# Patient Record
Sex: Female | Born: 2000 | Race: Black or African American | Hispanic: No | Marital: Single | Smoking: Current every day smoker
Health system: Southern US, Community
[De-identification: ages and names within clinical notes are randomized; demographics above are authoritative.]

## PROBLEM LIST (undated history)

## (undated) DIAGNOSIS — R011 Cardiac murmur, unspecified: Secondary | ICD-10-CM

## (undated) DIAGNOSIS — T7840XA Allergy, unspecified, initial encounter: Secondary | ICD-10-CM

## (undated) DIAGNOSIS — F329 Major depressive disorder, single episode, unspecified: Secondary | ICD-10-CM

## (undated) DIAGNOSIS — R51 Headache: Secondary | ICD-10-CM

## (undated) DIAGNOSIS — F909 Attention-deficit hyperactivity disorder, unspecified type: Secondary | ICD-10-CM

## (undated) DIAGNOSIS — F419 Anxiety disorder, unspecified: Secondary | ICD-10-CM

## (undated) DIAGNOSIS — R569 Unspecified convulsions: Secondary | ICD-10-CM

## (undated) DIAGNOSIS — H539 Unspecified visual disturbance: Secondary | ICD-10-CM

## (undated) DIAGNOSIS — F32A Depression, unspecified: Secondary | ICD-10-CM

## (undated) HISTORY — DX: Depression, unspecified: F32.A

## (undated) HISTORY — DX: Major depressive disorder, single episode, unspecified: F32.9

---
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
## 2018-05-26 ENCOUNTER — Emergency Department (HOSPITAL_COMMUNITY)
Admission: EM | Admit: 2018-05-26 | Discharge: 2018-05-27 | Disposition: A | Discharge status: Other Acute Inpatient Hospital | Payer: Medicaid Other | Attending: Emergency Medicine | Admitting: Emergency Medicine

## 2018-05-26 ENCOUNTER — Other Ambulatory Visit: Payer: Self-pay

## 2018-05-26 DIAGNOSIS — F909 Attention-deficit hyperactivity disorder, unspecified type: Secondary | ICD-10-CM | POA: Diagnosis not present

## 2018-05-26 DIAGNOSIS — F332 Major depressive disorder, recurrent severe without psychotic features: Secondary | ICD-10-CM | POA: Diagnosis not present

## 2018-05-26 DIAGNOSIS — Z91040 Latex allergy status: Secondary | ICD-10-CM | POA: Insufficient documentation

## 2018-05-26 DIAGNOSIS — R45851 Suicidal ideations: Secondary | ICD-10-CM | POA: Diagnosis not present

## 2018-05-26 DIAGNOSIS — T43506A Underdosing of unspecified antipsychotics and neuroleptics, initial encounter: Secondary | ICD-10-CM | POA: Diagnosis not present

## 2018-05-26 DIAGNOSIS — F10920 Alcohol use, unspecified with intoxication, uncomplicated: Secondary | ICD-10-CM

## 2018-05-26 DIAGNOSIS — F10120 Alcohol abuse with intoxication, uncomplicated: Secondary | ICD-10-CM | POA: Insufficient documentation

## 2018-05-26 DIAGNOSIS — R51 Headache: Secondary | ICD-10-CM | POA: Diagnosis not present

## 2018-05-26 DIAGNOSIS — Y906 Blood alcohol level of 120-199 mg/100 ml: Secondary | ICD-10-CM | POA: Insufficient documentation

## 2018-05-26 DIAGNOSIS — Z79899 Other long term (current) drug therapy: Secondary | ICD-10-CM | POA: Diagnosis not present

## 2018-05-26 DIAGNOSIS — Z8709 Personal history of other diseases of the respiratory system: Secondary | ICD-10-CM | POA: Diagnosis not present

## 2018-05-26 DIAGNOSIS — R05 Cough: Secondary | ICD-10-CM | POA: Insufficient documentation

## 2018-05-26 DIAGNOSIS — F329 Major depressive disorder, single episode, unspecified: Secondary | ICD-10-CM | POA: Diagnosis present

## 2018-05-26 LAB — ETHANOL: Alcohol, Ethyl (B): 147 mg/dL — ABNORMAL HIGH (ref <10)

## 2018-05-26 LAB — COMPREHENSIVE METABOLIC PANEL
ALT: 14 U/L (ref 0–44)
AST: 22 U/L (ref 15–41)
Albumin: 4 g/dL (ref 3.5–5.0)
Alkaline Phosphatase: 76 U/L (ref 38–126)
Anion gap: 14 (ref 5–15)
BUN: <5 mg/dL — ABNORMAL LOW (ref 6–20)
CO2: 19 mmol/L — ABNORMAL LOW (ref 22–32)
Calcium: 8.8 mg/dL — ABNORMAL LOW (ref 8.9–10.3)
Chloride: 104 mmol/L (ref 98–111)
Creatinine, Ser: 0.81 mg/dL (ref 0.44–1.00)
GFR calc Af Amer: >60 mL/min (ref >60)
GFR calc non Af Amer: >60 mL/min (ref >60)
Glucose, Bld: 180 mg/dL — ABNORMAL HIGH (ref 70–99)
Potassium: 3.1 mmol/L — ABNORMAL LOW (ref 3.5–5.1)
Sodium: 137 mmol/L (ref 135–145)
Total Bilirubin: 0.2 mg/dL — ABNORMAL LOW (ref 0.3–1.2)
Total Protein: 7.6 g/dL (ref 6.5–8.1)

## 2018-05-26 LAB — CBC
HCT: 41 % (ref 36.0–46.0)
Hemoglobin: 13.4 g/dL (ref 12.0–15.0)
MCH: 31.6 pg (ref 26.0–34.0)
MCHC: 32.7 g/dL (ref 30.0–36.0)
MCV: 96.7 fL (ref 80.0–100.0)
Platelets: 286 10*3/uL (ref 150–400)
RBC: 4.24 MIL/uL (ref 3.87–5.11)
RDW: 12.6 % (ref 11.5–15.5)
WBC: 4.2 10*3/uL (ref 4.0–10.5)
nRBC: 0 % (ref 0.0–0.2)

## 2018-05-26 LAB — RAPID URINE DRUG SCREEN, HOSP PERFORMED
Amphetamines: NOT DETECTED
Barbiturates: NOT DETECTED
Benzodiazepines: NOT DETECTED
Cocaine: POSITIVE — AB
Opiates: NOT DETECTED
Tetrahydrocannabinol: POSITIVE — AB

## 2018-05-26 LAB — I-STAT BETA HCG BLOOD, ED (MC, WL, AP ONLY): I-stat hCG, quantitative: <5 m[IU]/mL (ref <5)

## 2018-05-26 LAB — SALICYLATE LEVEL: Salicylate Lvl: <7 mg/dL (ref 2.8–30.0)

## 2018-05-26 LAB — ACETAMINOPHEN LEVEL: Acetaminophen (Tylenol), Serum: <10 ug/mL — ABNORMAL LOW (ref 10–30)

## 2018-05-26 MED ORDER — POTASSIUM CHLORIDE CRYS ER 20 MEQ PO TBCR
40.0000 meq | EXTENDED_RELEASE_TABLET | Freq: Once | ORAL | Status: AC
  Administered 2018-05-26: 40 meq via ORAL
  Filled 2018-05-26: qty 2

## 2018-05-26 MED ORDER — IBUPROFEN 400 MG PO TABS: 600.0000 mg | ORAL_TABLET | Freq: Four times a day (QID) | ORAL | Status: DC | PRN

## 2018-05-26 MED ORDER — ALBUTEROL SULFATE HFA 108 (90 BASE) MCG/ACT IN AERS: 1.0000 | INHALATION_SPRAY | Freq: Four times a day (QID) | RESPIRATORY_TRACT | Status: DC | PRN

## 2018-05-26 MED ORDER — NICOTINE 21 MG/24HR TD PT24: 21.0000 mg | MEDICATED_PATCH | Freq: Once | TRANSDERMAL | Status: DC | PRN

## 2018-05-26 MED ORDER — ACETAMINOPHEN 500 MG PO TABS
1000.0000 mg | ORAL_TABLET | Freq: Once | ORAL | Status: AC
  Administered 2018-05-26: 03:00:00 1000 mg via ORAL
  Filled 2018-05-26: qty 2

## 2018-05-26 NOTE — ED Notes (Signed)
RN phone given to patient to call her mother.

## 2018-05-26 NOTE — ED Notes (Signed)
Pt belongings inventoried and placed in locker 2 

## 2018-05-26 NOTE — ED Notes (Signed)
Pt. given urine specimen cup at 0300 and asked to provide sample. Pt. reported she could not provide sample. Pt. asked to provide sample again at 0530 and Pt. again said she could not provide sample.

## 2018-05-26 NOTE — ED Notes (Signed)
Pt notified of plan to transport to [facility]. She is agreeable.

## 2018-05-26 NOTE — Progress Notes (Signed)
CSW spoke with Rocky Link in admissions at [facility] and assessed that pt is still being reviewed by his facility. He or other admissions staff will contact Disposition with decision.    Wells Guiles, LCSW, LCAS Disposition CSW [facility]/TTS [phone]

## 2018-05-26 NOTE — ED Notes (Signed)
TTS in process 

## 2018-05-26 NOTE — ED Triage Notes (Signed)
BIB [facility] from home, voluntarily. [facility] called by pt via hotline. Pt threatening suicide. Quit taking percocet 2 days ago. Told [facility] she was trying to get clean. Reporting cough, headache. Hx suicide attempt.

## 2018-05-26 NOTE — Progress Notes (Signed)
TTS consulted with [name], PA who recommends inpt treatment. TTS to seek placement due to no appropriate beds at [facility] per [facility]. EDP Antony Madura, PA-C and pt's nurse Si Raider, RN have been advised of the disposition.  Princess Bruins, MSW, LCSW Therapeutic Triage Specialist  [phone]

## 2018-05-26 NOTE — Progress Notes (Signed)
CSW contacted Rocky Link at [facility] to relate that patient is willing to come to treatment Voluntarily.  He was being pulled to assist in his ED and would need to review patient's chart with his admittting physician.  He will call back in 30-45 minutes.  Timmothy Euler. Kaylyn Lim, MSW, LCSWA Disposition Clinical Social Work [phone] (cell) [phone] (office)

## 2018-05-26 NOTE — ED Notes (Signed)
Pt ambulated self efficiently to restroom with no difficulty. Pt returned safely to bedside. 
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
## 2018-05-26 NOTE — Progress Notes (Signed)
CSW called [facility] and was told that the A/C was still in the ED.  Will advise Brand Males. Second shift CSW to follow-up for placement.  Timmothy Euler. Kaylyn Lim, MSW, LCSWA Disposition Clinical Social Work [phone] (cell) [phone] (office)

## 2018-05-26 NOTE — Progress Notes (Signed)
Pt. meets criteria for inpatient treatment per [name], PA.  No appropriate beds available at [facility]. Referred out to the following hospitals: [facility]  [name]  [facility]  [facility]  [facility]  [facility]  [facility]     Disposition CSW will continue to follow for placement.  Timmothy Euler. Kaylyn Lim, MSW, LCSWA Disposition Clinical Social Work [phone] (cell) [phone] (office)

## 2018-05-26 NOTE — ED Notes (Signed)
Up to bathroom no  Problems noted.

## 2018-05-26 NOTE — ED Notes (Signed)
ED Provider at bedside. 

## 2018-05-26 NOTE — Progress Notes (Signed)
CSW told by Gearldine Bienenstock, RN in [facility] ED that pat's mother may be her guardian.  CSW called patient's mother, Joseph Pierini [phone]), who asked if she could call back in 10-20 minutes.  Timmothy Euler. Kaylyn Lim, MSW, LCSWA Disposition Clinical Social Work [phone] (cell) [phone] (office)

## 2018-05-26 NOTE — ED Provider Notes (Signed)
[name] EMERGENCY DEPARTMENT Provider Note   CSN: [id number] Arrival date & time: 05/26/18  0139    History   Chief Complaint Chief Complaint  Patient presents with  . Suicidal  . Cough  . Shortness of Breath    HPI Ashley Carlson is a 18 y.o. female.    18 year old female with a history of ADHD, anxiety, asthma, depression presents to the emergency department, brought in by [employer] from home voluntarily for suicidal thoughts.  Patient called a suicide hotline and was threatening suicide tonight.  She expresses thoughts of not wanting to be alive, but does not report any plans for self-harm or suicide.  Reports history of suicide attempt from cutting her wrists, though no scarring or evidence of prior trauma visible.  Endorses use of alcohol daily.  States that she drank 1/5 of Hennessy earlier today.  She also had a few beers.  Quit taking Percocet 2 days ago and told [employer] that she was trying to get clean.  She is complaining of a headache at this time for which she has not taken any medication.  Is supposed to be on psychiatric medications, but has been noncompliant.  The history is provided by the patient. No language interpreter was used.    Past Medical History:  Diagnosis Date  . ADHD (attention deficit hyperactivity disorder)    pt. takes vyvanse  . Allergy    Environmental allergies, does not take meds  . Anxiety   . Asthma    uses albuterol inhaler PRN  . Depression   . Headache(784.0)   . Heart murmur    Does not require prophylaxis  . Seizures (HCC)    reports h/o seizures when she was younger but stopped at age 12yo.    . 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 Attention deficit hyperactivity disorder, hyperactive-impulsive type 08/27/2011  . Polysubstance abuse (HCC) 08/27/2011     No past surgical history on file.   OB History    Gravida  0   Para  0   Term  0   Preterm  0   AB  0   Living  0     SAB  0   TAB  0   Ectopic  0   Multiple  0   Live Births               Home Medications    Prior to Admission medications   Medication Sig Start Date End Date Taking? Authorizing Provider  albuterol (PROVENTIL HFA;VENTOLIN HFA) 108 (90 BASE) MCG/ACT inhaler Inhale 2 puffs into the lungs every 4 (four) hours as needed for wheezing or shortness of breath. 02/12/14   Dione Booze, MD  albuterol (PROVENTIL HFA;VENTOLIN HFA) 108 (90 Base) MCG/ACT inhaler Inhale 1-2 puffs into the lungs every 6 (six) hours as needed for wheezing or shortness of breath. 11/29/17   Hedges, Tinnie Gens, PA-C  albuterol (PROVENTIL) (2.5 MG/3ML) 0.083% nebulizer solution Take 3 mLs (2.5 mg total) by nebulization every 4 (four) hours as needed for wheezing or shortness of breath. Patient not taking: Reported on 11/29/2017 02/12/14   Dione Booze, MD    Family History Family History  Problem Relation Age of Onset  . Depression Paternal Grandmother        pt. is vague regarding FMhx   .  Drug abuse Cousin        pt. is vague regarding FMHx    Social History Social History   Tobacco Use  . Smoking status: Never Smoker  . Smokeless tobacco: Never Used  Substance Use Topics  . Alcohol use: Yes    Comment: daily  . Drug use: No     Allergies   Eggs or egg-derived products; Latex; and Milk-related compounds   Review of Systems Review of Systems  Ten systems reviewed and are negative for acute change, except as noted in the HPI.    Physical Exam Updated Vital Signs BP (!) 103/57 (BP Location: Left Arm)   Pulse (!) 105   Temp 98.8 F (37.1 C) (Oral)   Resp 16   LMP 05/15/2018   SpO2 97%   Physical Exam Vitals signs and nursing note reviewed.  Constitutional:      General: She is not in acute distress.    Appearance: She is well-developed. She is not  diaphoretic.     Comments: Nontoxic appearing and in NAD  HENT:     Head: Normocephalic and atraumatic.  Eyes:     General: No scleral icterus.    Conjunctiva/sclera: Conjunctivae normal.  Neck:     Musculoskeletal: Normal range of motion.  Pulmonary:     Effort: Pulmonary effort is normal. No respiratory distress.     Comments: Respirations even and unlabored Musculoskeletal: Normal range of motion.  Skin:    General: Skin is warm and dry.     Coloration: Skin is not pale.     Findings: No erythema or rash.     Comments: No abrasions or scarring to forearms.  Neurological:     Mental Status: She is alert and oriented to person, place, and time.     Coordination: Coordination normal.     Comments: Ambulatory with steady gait.  Psychiatric:        Behavior: Behavior normal.     Comments: Endorses depression with thoughts of "wanting to not be here anymore"; no suicidal plan or other plan for self harm.      ED Treatments / Results  Labs (all labs ordered are listed, but only abnormal results are displayed) Labs Reviewed  COMPREHENSIVE METABOLIC PANEL - Abnormal; Notable for the following components:      Result Value   Potassium 3.1 (*)    CO2 19 (*)    Glucose, Bld 180 (*)    BUN <5 (*)    Calcium 8.8 (*)    Total Bilirubin 0.2 (*)    All other components within normal limits  ETHANOL - Abnormal; Notable for the following components:   Alcohol, Ethyl (B) 147 (*)    All other components within normal limits  ACETAMINOPHEN LEVEL - Abnormal; Notable for the following components:   Acetaminophen (Tylenol), Serum <10 (*)    All other components within normal limits  SALICYLATE LEVEL  CBC  RAPID URINE DRUG SCREEN, HOSP PERFORMED  I-STAT BETA HCG BLOOD, ED ([facility], [facility], [facility] ONLY)    EKG None  Radiology No results found.  Procedures Procedures (including critical care time)  Medications Ordered in ED Medications  albuterol (PROVENTIL HFA;VENTOLIN HFA) 108 (90 Base)  MCG/ACT inhaler 1-2 puff (has no administration in time range)  nicotine (NICODERM CQ - dosed in mg/24 hours) patch 21 mg (has no administration in time range)  ibuprofen (ADVIL,MOTRIN) tablet 600 mg (has no administration in time range)  acetaminophen (TYLENOL) tablet 1,000 mg (1,000 mg Oral  Given 05/26/18 0237)  potassium chloride SA (K-DUR,KLOR-CON) CR tablet 40 mEq (40 mEq Oral Given 05/26/18 0406)    3:36 AM The patient has been evaluated by TTS who recommend placement at outside facility for inpatient treatment.  Labs are generally reassuring.  Alcohol is elevated at 147.  She will be given oral potassium for mild hypokalemia.   Initial Impression / Assessment and Plan / ED Course  I have reviewed the triage vital signs and the nursing notes.  Pertinent labs & imaging results that were available during my care of the patient were reviewed by me and considered in my medical decision making (see chart for details).        Patient presenting voluntarily for worsening depression.  Does not express any specific suicidal thoughts or plan.  Does generally feel dysthymic.  Drank ETOH prior to arrival with level of 147.  UDS pending.  Otherwise, medically cleared.  The patient has been evaluated by TTS who recommend continue inpatient management.  Seeking outside placement at this time.  Disposition to be determined by oncoming ED provider.   Final Clinical Impressions(s) / ED Diagnoses   Final diagnoses:  Suicidal ideation  Severe episode of recurrent major depressive disorder, without psychotic features (HCC)  Alcoholic intoxication without complication [facility])    ED Discharge Orders    None       Antony Madura, PA-C 05/26/18 0520    Palumbo, April, MD 05/26/18 [phone]

## 2018-05-26 NOTE — ED Notes (Signed)
Ordered breakfast tray  

## 2018-05-27 NOTE — ED Notes (Addendum)
BH called and advised the pt has a bed at [facility] at 0800 hrs.

## 2018-05-27 NOTE — Progress Notes (Signed)
Crystal with [facility] called to report the pt has been accepted to their facility, accepting physician is Dr. Shawnee Knapp, MD. Call to repory [phone], bed will be ready at 8am. ED staff Lorin Picket, RN has been advised and states he will inform Becky.  Crystal provided a call back number of [phone] for any questions.  Princess Bruins, MSW, LCSW Therapeutic Triage Specialist  [phone]

## 2018-05-27 NOTE — ED Provider Notes (Signed)
Emergency Medicine Observation Re-evaluation Note  Ashley Carlson is a 18 y.o. female, seen on rounds today.  Pt initially presented to the ED for complaints of Suicidal; Cough; and Shortness of Breath Currently, the patient is sleeping.  Physical Exam  BP (!) 105/58 (BP Location: Right Leg)   Pulse 88   Temp 98.3 F (36.8 C) (Oral)   Resp 16   LMP 05/15/2018   SpO2 97%  Physical Exam  Sleeping, respirations even and unlabored.  ED Course / MDM  EML:JQGB   I have reviewed the labs performed to date as well as medications administered while in observation.  Recent changes in the last 24 hours include stable, awaiting transfer to [facility] this morning. Plan  Current plan is for transfer to [facility] this morning at 0800hrs. Patient is not under full IVC at this time.   Jeannie Fend, PA-C 05/27/18 2010    Shaune Pollack, MD 05/27/18 [phone]

## 2018-05-27 NOTE — ED Notes (Signed)
ORDERED BFAST TRAY  

## 2018-05-27 NOTE — ED Notes (Signed)
Pt had voiced understanding and acceptance of tx plan - Pt called her mother from phone at nurses' desk - who also voiced acceptance of tx plan. Mother initially asked to speak w/RN - mother then noted to be upset stating she wanted to speak w/anyone but this RN. When RN asked pt what was going on, pt stated her mother became upset w/RN she had spoken w/yesterday. When this RN advised mother this is not the same RN she spoke w/yesterday, mother apologized and advised she would get loud or do anything she has to do when it comes her child. Pt's mother re-spoke w/pt after speaking w/RN and is aware pt will be transported to [facility] w/in the next 30 minutes and that pt will need to call to give her a code after her assessment has been completed. ALL of pt's belongings - 1 labeled belongings bag and 1 valuables envelope - [place]. Pt aware.

## 2018-06-04 ENCOUNTER — Other Ambulatory Visit: Payer: Self-pay

## 2018-06-04 ENCOUNTER — Encounter (HOSPITAL_COMMUNITY): Payer: Self-pay | Admitting: Obstetrics and Gynecology

## 2018-06-04 ENCOUNTER — Emergency Department (HOSPITAL_COMMUNITY)
Admission: EM | Admit: 2018-06-04 | Discharge: 2018-06-04 | Disposition: A | Discharge status: Home / Self Care | Payer: Medicaid Other | Attending: Emergency Medicine | Admitting: Emergency Medicine

## 2018-06-04 DIAGNOSIS — F17200 Nicotine dependence, unspecified, uncomplicated: Secondary | ICD-10-CM | POA: Diagnosis not present

## 2018-06-04 DIAGNOSIS — L02411 Cutaneous abscess of right axilla: Secondary | ICD-10-CM

## 2018-06-04 DIAGNOSIS — J45909 Unspecified asthma, uncomplicated: Secondary | ICD-10-CM | POA: Diagnosis not present

## 2018-06-04 DIAGNOSIS — M79621 Pain in right upper arm: Secondary | ICD-10-CM | POA: Diagnosis present

## 2018-06-04 LAB — POC URINE PREG, ED: Preg Test, Ur: NEGATIVE

## 2018-06-04 MED ORDER — LIDOCAINE-EPINEPHRINE (PF) 2 %-1:200000 IJ SOLN
10.0000 mL | Freq: Once | INTRAMUSCULAR | Status: AC
  Administered 2018-06-04: 10 mL
  Filled 2018-06-04: qty 10

## 2018-06-04 MED ORDER — LORAZEPAM 2 MG/ML IJ SOLN: 2.0000 mg | Freq: Once | INTRAMUSCULAR | Status: DC

## 2018-06-04 MED ORDER — DOXYCYCLINE HYCLATE 100 MG PO CAPS: 100.0000 mg | ORAL_CAPSULE | Freq: Two times a day (BID) | ORAL | 0 refills | Status: AC

## 2018-06-04 MED ORDER — LORAZEPAM 2 MG/ML IJ SOLN
2.0000 mg | Freq: Once | INTRAMUSCULAR | Status: AC
  Administered 2018-06-04: 2 mg via INTRAMUSCULAR
  Filled 2018-06-04: qty 1

## 2018-06-04 MED ORDER — HYDROCODONE-ACETAMINOPHEN 5-325 MG PO TABS
2.0000 | ORAL_TABLET | Freq: Once | ORAL | Status: AC
  Administered 2018-06-04: 2 via ORAL
  Filled 2018-06-04: qty 2

## 2018-06-04 NOTE — ED Notes (Signed)
Pt stated she would like numbing spray before her abscess is accessed with the needle/numbing medication. RN aware.

## 2018-06-04 NOTE — ED Notes (Signed)
Incision and drainage tray placed on bedside table.

## 2018-06-04 NOTE — ED Notes (Signed)
RN explained to patient that a urine sample was needed, patient reports she is unable to pee at this time. Pt reports she will tell RN when she can provide a sample

## 2018-06-04 NOTE — Discharge Instructions (Addendum)
You have been diagnosed today with Abscess of the Right Armpit.  At this time there does not appear to be the presence of an emergent medical condition, however there is always the potential for conditions to change. Please read and follow the below instructions.  Please return to the Emergency Department immediately for any new or worsening symptoms. Please be sure to follow up with your Primary Care Provider within one week regarding your visit today; please call their office to schedule an appointment even if you are feeling better for a follow-up visit. Please take the antibiotic doxycycline as prescribed for the next 7 days.  Please follow-up for recheck of your abscess and removal of the packing material in 2 days (48 hours).  You may follow-up for recheck at your primary care provider's office, and urgent care or if necessary here at the emergency department.  You may gently rinse off the area with warm clean water and gently with soap over the next 2 days to facilitate continued drainage.  Get help right away if: You have very bad (severe) pain. You see red streaks on your skin spreading away from the abscess. You have severe pain or bleeding. You cannot eat or drink without vomiting. You have decreased urine output. You become short of breath. You have chest pain. You cough up blood. The area where the incision and drainage occurred becomes numb or it tingles. You have fever Any new/concerning or worsening symptoms  Please read the additional information packets attached to your discharge summary.  Do not take your medicine if  develop an itchy rash, swelling in your mouth or lips, or difficulty breathing.

## 2018-06-04 NOTE — ED Notes (Signed)
Patient advised to NOT DRIVE HOME due to being given narcotic medication while in hospital. Patient stated she was having a friend pick her up from hospital. Patient was advised from PA and this Clinical research associate before discharge.   Patient ambulated out of ED w/ a steady gate.

## 2018-06-04 NOTE — ED Notes (Signed)
Bed: WTR5 Expected date:  Expected time:  Means of arrival:  Comments: 

## 2018-06-04 NOTE — ED Provider Notes (Addendum)
[facility]-EMERGENCY DEPT Provider Note   CSN: [id number] Arrival date & time: 06/04/18  1019    History   Chief Complaint Chief Complaint  Patient presents with  . Abscess    HPI Karsten A R Olsson is a 18 y.o. female presenting today for right axillary pain swelling and erythema.  Patient reports that over the past 4 days she has had increasing area of swelling and redness underneath her right axilla reports constant moderate intensity aching throb worsened with movement and palpation and without alleviating factors.  Patient has never had a prior abscess however she reports that multiple members of her family have had abscesses requiring incision and drainage in the past, patient reports that she shaves her armpits regularly.  Patient denies any fever/chills, recent illness or additional concerns today.     HPI  Past Medical History:  Diagnosis Date  . ADHD (attention deficit hyperactivity disorder)    pt. takes vyvanse  . Allergy    Environmental allergies, does not take meds  . Anxiety   . Asthma    uses albuterol inhaler PRN  . Depression   . Headache(784.0)   . Heart murmur    Does not require prophylaxis  . Seizures (HCC)    reports h/o seizures when she was younger but stopped at age 4yo.    . 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 Attention deficit hyperactivity disorder, hyperactive-impulsive type 08/27/2011  . Polysubstance abuse (HCC) 08/27/2011    History reviewed. No pertinent surgical history.   OB History    Gravida  0   Para  0   Term  0   Preterm  0   AB  0   Living  0     SAB  0   TAB  0   Ectopic  0   Multiple  0   Live Births               Home Medications    Prior to Admission medications   Medication Sig Start Date End Date Taking?  Authorizing Provider  albuterol (PROVENTIL HFA;VENTOLIN HFA) 108 (90 Base) MCG/ACT inhaler Inhale 1-2 puffs into the lungs every 6 (six) hours as needed for wheezing or shortness of breath. 11/29/17  Yes Hedges, Tinnie Gens, PA-C  albuterol (PROVENTIL) (2.5 MG/3ML) 0.083% nebulizer solution Take 3 mLs (2.5 mg total) by nebulization every 4 (four) hours as needed for wheezing or shortness of breath. 02/12/14  Yes Dione Booze, MD  doxycycline (VIBRAMYCIN) 100 MG capsule Take 1 capsule (100 mg total) by mouth 2 (two) times daily for 7 days. 06/04/18 06/11/18  Bill Salinas, PA-C    Family History Family History  Problem Relation Age of Onset  . Depression Paternal Grandmother        pt. is vague regarding FMhx   . Drug abuse Cousin        pt. is vague regarding FMHx    Social History Social History   Tobacco Use  . Smoking status: Current Every Day Smoker  . Smokeless tobacco: Never Used  Substance Use Topics  . Alcohol use: Yes    Comment: daily  . Drug use: No     Allergies   Aspirin; Wheat bran; Eggs or egg-derived products; Latex; and Milk-related compounds   Review of Systems Review of  Systems  Constitutional: Negative.  Negative for chills, diaphoresis and fever.  Respiratory: Negative.  Negative for cough and shortness of breath.   Cardiovascular: Negative.  Negative for chest pain.  Gastrointestinal: Negative.  Negative for abdominal pain, diarrhea, nausea and vomiting.  Musculoskeletal: Negative for arthralgias and myalgias.  Skin: Positive for color change (Abscess of right axilla).  Neurological: Negative.  Negative for weakness and headaches.  All other systems reviewed and are negative.  Physical Exam Updated Vital Signs BP 119/80   Pulse (!) 115   Temp 98.2 F (36.8 C) (Oral)   Resp 18   Ht 5\' 5"  (1.651 m)   Wt 56.7 kg   LMP 05/15/2018   SpO2 100%   BMI 20.80 kg/m   Physical Exam Constitutional:      General: She is not in acute distress.     Appearance: Normal appearance. She is well-developed. She is not ill-appearing or diaphoretic.  HENT:     Head: Normocephalic and atraumatic.     Right Ear: External ear normal.     Left Ear: External ear normal.     Nose: Nose normal.  Eyes:     General: Vision grossly intact. Gaze aligned appropriately.     Pupils: Pupils are equal, round, and reactive to light.  Neck:     Musculoskeletal: Normal range of motion.     Trachea: Trachea and phonation normal. No tracheal deviation.  Pulmonary:     Effort: Pulmonary effort is normal. No respiratory distress.  Abdominal:     General: There is no distension.     Palpations: Abdomen is soft.     Tenderness: There is no abdominal tenderness. There is no guarding or rebound.  Musculoskeletal: Normal range of motion.     Comments: Full range of motion and appropriate strength to all movements of the bilateral upper extremities.  Skin:    General: Skin is warm and dry.          Comments: Patient with 3 cm x 2 cm area of fluctuance of the right axilla, mild amount of surrounding cellulitis, area is tender to palpation.  Recently shaved hair is noted to the right axilla.  Neurological:     Mental Status: She is alert.     GCS: GCS eye subscore is 4. GCS verbal subscore is 5. GCS motor subscore is 6.     Comments: Speech is clear and goal oriented, follows commands Major Cranial nerves without deficit, no facial droop Moves extremities without ataxia, coordination intact Normal gait  Psychiatric:        Behavior: Behavior normal.    ED Treatments / Results  Labs (all labs ordered are listed, but only abnormal results are displayed) Labs Reviewed  POC URINE PREG, ED    EKG None  Radiology No results found.  Procedures .Marland Kitchen.Incision and Drainage Date/Time: 06/04/2018 12:35 PM Performed by: Bill SalinasMorelli, Jaxston Chohan A, PA-C Authorized by: Bill SalinasMorelli, Keona Sheffler A, PA-C   Consent:    Consent obtained:  Verbal   Consent given by:  Patient    Risks discussed:  Incomplete drainage, pain, infection, bleeding and damage to other organs Location:    Type:  Abscess   Size:  3cmx2cm   Location:  Upper extremity   Upper extremity location: Right Axilla. Pre-procedure details:    Skin preparation:  Betadine Sedation:    Sedation type:  Anxiolysis Anesthesia (see MAR for exact dosages):    Anesthesia method:  Topical application and local infiltration   Topical anesthesia:  Spray.   Local anesthetic:  Lidocaine 1% WITH epi Procedure type:    Complexity:  Simple Procedure details:    Needle aspiration: yes     Needle size:  22 G   Incision types:  Single straight   Scalpel blade:  11   Wound management:  Probed and deloculated   Drainage:  Purulent   Drainage amount:  Copious   Wound treatment:  Wound left open and drain placed   Packing materials:  1/4 in iodoform gauze   Amount 1/4" iodoform:  2cm within; 2 cm outside Post-procedure details:    Patient tolerance of procedure:  Tolerated with difficulty   (including critical care time)  Medications Ordered in ED Medications  HYDROcodone-acetaminophen (NORCO/VICODIN) 5-325 MG per tablet 2 tablet (2 tablets Oral Given 06/04/18 1057)  lidocaine-EPINEPHrine (XYLOCAINE W/EPI) 2 %-1:200000 (PF) injection 10 mL (10 mLs Infiltration Given by Other 06/04/18 1140)  LORazepam (ATIVAN) injection 2 mg (2 mg Intramuscular Given 06/04/18 1136)     Initial Impression / Assessment and Plan / ED Course  I have reviewed the triage vital signs and the nursing notes.  Pertinent labs & imaging results that were available during my care of the patient were reviewed by me and considered in my medical decision making (see chart for details).  Clinical Course as of Jun 04 1247  Sun Jun 04, 2018  1122 Patient unable to tolerated procedure; jumping away from need and unable to inject lidocaine. Discussed with Dr. Freida Busman; will give 2mg  Ativan IM and reattempt.   [BM]  1227 Successful incision and  drainage of right axillary abscess.   [BM]    Clinical Course User Index [BM] Bill Salinas, PA-C    18 year old female presenting with single skin abscess of the right axilla that is amenable to incision and drainage.  Incision and drainage performed as above, abscess was packed as above.  Patient instructed to have wound rechecked in 2 days.  Encouraged warm soaks at home/flushing and twice daily dressing changes.  Patient with mild surrounding cellulitis of the skin, prescribed doxycycline twice daily x7 days.  Extensive return precautions discussed with patient who states understanding.  Urine pregnancy test today is negative and she denies any known antibiotic allergies.   ---------------------- Prior to administration of sedating medications here in the emergency department patient was advised that she cannot drive home and her family member need to pick her up.  She stated understanding.  At discharge I rediscussed this with the patient and despite her feeling that she could not drive I advised her that legally she could not drive home and needed a family member to pick her up.  Patient called her mother on the phone and is waiting outside of the emergency department for a ride home.  [employer] officer on duty was also advised of the situation and is discussing with the patient that she is not to drive home.  Patient is alert and oriented x4 with steady gait she states full understanding of situation and that she should not drive a vehicle for multiple hours, she reports that her family member is on the way pick her up.  At this time there does not appear to be any evidence of an acute emergency medical condition and the patient appears stable for discharge with appropriate outpatient follow up. Diagnosis was discussed with patient who verbalizes understanding of care plan and is agreeable to discharge. I have discussed return precautions with patient who verbalizes understanding of return  precautions. Patient encouraged to follow-up with their PCP. All questions answered.  Patient has been discharged in good condition.  ------------ Addendum: Per [employer] officer patient left with her mother.   Note: Portions of this report may have been transcribed using voice recognition software. Every effort was made to ensure accuracy; however, inadvertent computerized transcription errors may still be present. Final Clinical Impressions(s) / ED Diagnoses   Final diagnoses:  Abscess of axilla, right    ED Discharge Orders         Ordered    doxycycline (VIBRAMYCIN) 100 MG capsule  2 times daily     06/04/18 1246           Elizabeth Palau 06/04/18 1309    Lorre Nick, MD 06/04/18 1313    Bill Salinas, PA-C 06/04/18 1317    Lorre Nick, MD 06/05/18 1104

## 2018-06-04 NOTE — ED Notes (Addendum)
Patient made aware of need for urine sample. Patient stated she's unable to provide sample at this time. Patient stated she will make staff aware when able. Provider aware of delay.

## 2018-06-04 NOTE — ED Triage Notes (Signed)
Pt reports she has an abscess under her right arm. RN can visually see redness and swelling from abscess in right axilla. Pt reports 10/10 pain.

## 2018-06-13 ENCOUNTER — Emergency Department (HOSPITAL_COMMUNITY)
Admission: EM | Admit: 2018-06-13 | Discharge: 2018-06-13 | Payer: Medicaid Other | Attending: Emergency Medicine | Admitting: Emergency Medicine

## 2018-06-13 ENCOUNTER — Other Ambulatory Visit: Payer: Self-pay

## 2018-06-13 ENCOUNTER — Emergency Department (HOSPITAL_COMMUNITY): Payer: Medicaid Other

## 2018-06-13 ENCOUNTER — Encounter (HOSPITAL_COMMUNITY): Payer: Self-pay

## 2018-06-13 DIAGNOSIS — Z79899 Other long term (current) drug therapy: Secondary | ICD-10-CM | POA: Insufficient documentation

## 2018-06-13 DIAGNOSIS — Z91040 Latex allergy status: Secondary | ICD-10-CM | POA: Insufficient documentation

## 2018-06-13 DIAGNOSIS — F1099 Alcohol use, unspecified with unspecified alcohol-induced disorder: Secondary | ICD-10-CM | POA: Insufficient documentation

## 2018-06-13 DIAGNOSIS — F17200 Nicotine dependence, unspecified, uncomplicated: Secondary | ICD-10-CM | POA: Insufficient documentation

## 2018-06-13 DIAGNOSIS — J45909 Unspecified asthma, uncomplicated: Secondary | ICD-10-CM | POA: Insufficient documentation

## 2018-06-13 DIAGNOSIS — F419 Anxiety disorder, unspecified: Secondary | ICD-10-CM | POA: Diagnosis not present

## 2018-06-13 DIAGNOSIS — R4689 Other symptoms and signs involving appearance and behavior: Secondary | ICD-10-CM | POA: Diagnosis present

## 2018-06-13 DIAGNOSIS — E876 Hypokalemia: Secondary | ICD-10-CM | POA: Diagnosis not present

## 2018-06-13 DIAGNOSIS — F1020 Alcohol dependence, uncomplicated: Secondary | ICD-10-CM

## 2018-06-13 LAB — BASIC METABOLIC PANEL
Anion gap: 10 (ref 5–15)
BUN: 6 mg/dL (ref 6–20)
CO2: 22 mmol/L (ref 22–32)
Calcium: 8.7 mg/dL — ABNORMAL LOW (ref 8.9–10.3)
Chloride: 106 mmol/L (ref 98–111)
Creatinine, Ser: 0.87 mg/dL (ref 0.44–1.00)
GFR calc Af Amer: >60 mL/min (ref >60)
GFR calc non Af Amer: >60 mL/min (ref >60)
Glucose, Bld: 95 mg/dL (ref 70–99)
Potassium: 3.4 mmol/L — ABNORMAL LOW (ref 3.5–5.1)
Sodium: 138 mmol/L (ref 135–145)

## 2018-06-13 LAB — CBC
HCT: 38.9 % (ref 36.0–46.0)
Hemoglobin: 12.4 g/dL (ref 12.0–15.0)
MCH: 30.5 pg (ref 26.0–34.0)
MCHC: 31.9 g/dL (ref 30.0–36.0)
MCV: 95.8 fL (ref 80.0–100.0)
Platelets: 308 10*3/uL (ref 150–400)
RBC: 4.06 MIL/uL (ref 3.87–5.11)
RDW: 11.8 % (ref 11.5–15.5)
WBC: 6.5 10*3/uL (ref 4.0–10.5)
nRBC: 0 % (ref 0.0–0.2)

## 2018-06-13 MED ORDER — SODIUM CHLORIDE 0.9 % IV SOLN: Freq: Once | INTRAVENOUS | Status: DC

## 2018-06-13 MED ORDER — SODIUM CHLORIDE 0.9 % IV BOLUS
1000.0000 mL | Freq: Once | INTRAVENOUS | Status: AC
  Administered 2018-06-13: 1000 mL via INTRAVENOUS

## 2018-06-13 MED ORDER — POTASSIUM CHLORIDE CRYS ER 20 MEQ PO TBCR: 20.0000 meq | EXTENDED_RELEASE_TABLET | Freq: Two times a day (BID) | ORAL | 0 refills | Status: DC

## 2018-06-13 NOTE — ED Notes (Signed)
[employer] at bedside speaking to patient per her request

## 2018-06-13 NOTE — ED Notes (Signed)
Patient ambulated to restroom with minimal assistance. Urine sample at bedside.

## 2018-06-13 NOTE — ED Notes (Signed)
Pt asking multiple times upon arrival from EMS to make a phone call and to speak with [employer].

## 2018-06-13 NOTE — ED Notes (Signed)
Patient refused to sign for discharge. RN signed for discharge. Patient A&O x4 and ambulatory. In police custody upon discharge.

## 2018-06-13 NOTE — Discharge Instructions (Addendum)
Thank you for allowing me to care for you today in the Emergency Department.   Your labs today are reassuring.  Your potassium is slightly low.  Take 2 tablets of potassium daily for the next 5 days.  Use caution with drinking alcohol daily.  Resources within the community to help you with stopping drinking alcohol are attached.  Return to the emergency department for new or worsening symptoms.

## 2018-06-13 NOTE — ED Notes (Signed)
Patient attempting to call people on phone, stating "I cannot calm down unless I talk to my mother." Removed phone from room. Patient not cooperative with replacing blood pressure cuff back on arm. Currently has no complaints of chest pain or shortness of breath. Will continue to monitor patient.

## 2018-06-13 NOTE — ED Provider Notes (Signed)
[facility]-EMERGENCY DEPT Provider Note   CSN: [id number] Arrival date & time: 06/13/18  0112    History   Chief Complaint Chief Complaint  Patient presents with  . Aggressive Behavior    HPI Ashley Carlson is a 18 y.o. female with a history of ADHD, alcohol use disorder, anxiety, asthma, tobacco use disorder, cocaine use disorder, and cyclothymia who presents to the emergency department by EMS with a chief complaint of chest pain.  EMS reports the patient began having shortness of breath and chest pain after Ashley Carlson had been placed under arrest and was in the backseat of a police car.  The patient states " I think I blacked out, and I was having chest pain or shortness of breath.  I have a history of asthma.  I think he was having an asthma attack.  My chest pain or shortness of breath have gotten better.  When I can I talk to the police officer?"  The patient is unable to characterize the pain.  Ashley Carlson states chest pain has been intermittent, central and nonradiating.  Ashley Carlson does report an associated nonproductive cough. Denies wheezing, fever, chills, nausea, vomiting, abdominal pain, numbness, weakness, headache.  EMS reports Ashley Carlson was given a 5 of medazepam IM in route for anxiety.  Blood pressure was normotensive and the patient was tachycardic in the 130s on arrival.  No hypoxia.  Ashley Carlson is a 2 pack/day smoker.  Ashley Carlson reports that Ashley Carlson drank half a gallon of liquor earlier today.  Ashley Carlson states that her last drink was 1 hour prior to arrival.  Ashley Carlson denies any cocaine or other illicit substance use prior to arrival.    The history is provided by the patient. No language interpreter was used.    Past Medical History:  Diagnosis Date  . ADHD (attention deficit hyperactivity disorder)    pt. takes vyvanse  . Allergy    Environmental allergies, does not take meds  . Anxiety   . Asthma    uses albuterol inhaler PRN  . Depression   . Headache(784.0)   . Heart murmur     Does not require prophylaxis  . Seizures (HCC)    reports h/o seizures when Ashley Carlson was younger but stopped at age 89yo.    . 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 Attention deficit hyperactivity disorder, hyperactive-impulsive type 08/27/2011  . Polysubstance abuse (HCC) 08/27/2011    History reviewed. No pertinent surgical history.   OB History    Gravida  0   Para  0   Term  0   Preterm  0   AB  0   Living  0     SAB  0   TAB  0   Ectopic  0   Multiple  0   Live Births               Home Medications    Prior to Admission medications   Medication Sig Start Date End Date Taking? Authorizing Provider  albuterol (PROVENTIL HFA;VENTOLIN HFA) 108 (90 Base) MCG/ACT inhaler Inhale 1-2 puffs into the lungs every 6 (six) hours as needed for wheezing or shortness of breath. 11/29/17   Hedges, Tinnie GensJeffrey, PA-C  albuterol (PROVENTIL) (2.5 MG/3ML) 0.083% nebulizer solution Take 3 mLs (2.5 mg total) by nebulization every 4 (four) hours as  needed for wheezing or shortness of breath. 02/12/14   Dione Booze, MD  potassium chloride SA (K-DUR) 20 MEQ tablet Take 1 tablet (20 mEq total) by mouth 2 (two) times daily for 5 days. 06/13/18 06/18/18  Manuela Halbur, Coral Else, PA-C    Family History Family History  Problem Relation Age of Onset  . Depression Paternal Grandmother        pt. is vague regarding FMhx   . Drug abuse Cousin        pt. is vague regarding FMHx    Social History Social History   Tobacco Use  . Smoking status: Current Every Day Smoker  . Smokeless tobacco: Never Used  Substance Use Topics  . Alcohol use: Yes    Comment: daily  . Drug use: No     Allergies   Aspirin; Wheat bran; Eggs or egg-derived products; Latex; and Milk-related compounds   Review of Systems Review of Systems  Constitutional:  Negative for activity change, chills and fever.  HENT: Negative for congestion.   Eyes: Negative for visual disturbance.  Respiratory: Positive for cough and shortness of breath.   Cardiovascular: Positive for chest pain. Negative for palpitations and leg swelling.  Gastrointestinal: Negative for abdominal pain, diarrhea, nausea and vomiting.  Genitourinary: Negative for dysuria.  Musculoskeletal: Negative for back pain, myalgias, neck pain and neck stiffness.  Skin: Negative for rash.  Allergic/Immunologic: Negative for immunocompromised state.  Neurological: Negative for dizziness, weakness, numbness and headaches.  Psychiatric/Behavioral: Negative for confusion.     Physical Exam Updated Vital Signs BP (!) 102/58 (BP Location: Left Arm)   Pulse 99   Temp 98.9 F (37.2 C) (Oral)   Resp 20   Ht  (1.651 m)   Wt 56.7 kg   LMP 05/15/2018   SpO2 99%   BMI 20.80 kg/m   Physical Exam Vitals signs and nursing note reviewed.  Constitutional:      General: Ashley Carlson is not in acute distress.    Appearance: Ashley Carlson is not ill-appearing or diaphoretic.  HENT:     Head: Normocephalic.  Eyes:     Conjunctiva/sclera:     Right eye: Right conjunctiva is injected.     Left eye: Left conjunctiva is injected.  Neck:     Musculoskeletal: Normal range of motion and neck supple.  Cardiovascular:     Rate and Rhythm: Regular rhythm. Tachycardia present.     Pulses: Normal pulses.     Heart sounds: No murmur. No friction rub. No gallop.   Pulmonary:     Effort: Pulmonary effort is normal. No respiratory distress.     Breath sounds: No stridor. No wheezing, rhonchi or rales.  Chest:     Chest wall: No tenderness.  Abdominal:     General: There is no distension.     Palpations: Abdomen is soft. There is no mass.     Tenderness: There is no abdominal tenderness. There is no right CVA tenderness, left CVA tenderness, guarding or rebound.     Hernia: No hernia is present.  Musculoskeletal:      Right lower leg: No edema.     Left lower leg: No edema.  Skin:    General: Skin is warm.     Findings: No rash.  Neurological:     General: No focal deficit present.     Mental Status: Ashley Carlson is alert.     Comments: Speech is not slurred.  Answers questions appropriately.  Psychiatric:  Attention and Perception: Ashley Carlson is inattentive.        Mood and Affect: Mood is Camisha.        Speech: Speech is rapid and pressured.        Behavior: Behavior is agitated.        Thought Content: Thought content does not include homicidal or suicidal ideation. Thought content does not include homicidal or suicidal plan.      ED Treatments / Results  Labs (all labs ordered are listed, but only abnormal results are displayed) Labs Reviewed  BASIC METABOLIC PANEL - Abnormal; Notable for the following components:      Result Value   Potassium 3.4 (*)    Calcium 8.7 (*)    All other components within normal limits  CBC    EKG EKG Interpretation  Date/Time:  Tuesday June 13 2018 01:30:46 EDT Ventricular Rate:  133 PR Interval:    QRS Duration: 61 QT Interval:  287 QTC Calculation: 427 R Axis:   62 Text Interpretation:  Sinus tachycardia Confirmed by Ross Marcus ([id number]) on 06/13/2018 1:35:17 AM   Radiology Dg Chest Portable 1 View  Result Date: 06/13/2018 CLINICAL DATA:  Shortness of breath EXAM: PORTABLE CHEST 1 VIEW COMPARISON:  11/29/2017 FINDINGS: Heart and mediastinal contours are within normal limits. No focal opacities or effusions. No acute bony abnormality. IMPRESSION: Normal study. Electronically Signed   By: Charlett Nose M.D.   On: 06/13/2018 01:50    Procedures Procedures (including critical care time)  Medications Ordered in ED Medications  sodium chloride 0.9 % bolus 1,000 mL (0 mLs Intravenous Stopped 06/13/18 0346)     Initial Impression / Assessment and Plan / ED Course  I have reviewed the triage vital signs and the nursing notes.  Pertinent labs &  imaging results that were available during my care of the patient were reviewed by me and considered in my medical decision making (see chart for details).  18 year old female with a history of ADHD, alcohol use disorder, anxiety, asthma, tobacco use disorder, cocaine use disorder, and cyclothymia who presents to the emergency department by EMS after the patient began having chest pain or shortness of breath after Ashley Carlson had been placed under arrest.  Ashley Carlson does endorse alcohol use tonight, but denies cocaine or other IV or illicit drug use.  On arrival, Ashley Carlson is normotensive, but is tachycardic in the 130s.  Ashley Carlson was given 5 of IM midazolam by EMS.  EKG with sinus tachycardia.  Chest x-ray is unremarkable.  Will ensure the patient is ambulatory without ataxia given alcohol use.  Ashley Carlson does not otherwise clinically appear intoxicated.  Clinical Course as of Jun 13 350  Tue Jun 13, 2018  0221 Patient recheck.  Nursing staff reports that Ashley Carlson ambulated to the restroom without difficulty and did not have an ataxic gait.  Heart rate is improved from the 130s to the 110s.  Labs from earlier this month were reviewed.  Ashley Carlson did have some mild electrolyte abnormalities at that time.  Will order basic labs and give the patient an IV fluid bolus.   [MM]    Clinical Course User Index [MM] Lillyahna Hemberger A, PA-C   On re-evaluation, Ashley Carlson is resting comfortably.   CBC is unremarkable.  Potassium is 3.4.  Will discharge the patient with a 5-day course of potassium chloride.  Tachycardia has resolved with IV fluid bolus.  Suspect tachycardia may have been secondary to anxiety in the setting of ETOH use. Patient continues to deny shortness  of breath or chest pain.  Ashley Carlson is clinically sober.  Low suspicion for ACS, pneumonia, pneumothorax, esophageal rupture, infectious etiology, or asthma exacerbation.  Ashley Carlson is hemodynamically stable at this time and is safe for discharge.       Final Clinical Impressions(s) / ED Diagnoses    Final diagnoses:  Anxiety  Severe alcohol use disorder (HCC)  Hypokalemia    ED Discharge Orders         Ordered    potassium chloride SA (K-DUR) 20 MEQ tablet  2 times daily     06/13/18 0334           Frederik Pear A, PA-C 06/13/18 0352    Shon Baton, MD 06/13/18 [phone]

## 2018-06-13 NOTE — ED Triage Notes (Signed)
Arrived via EMS, was in the back of a cop car and started to complain of SOB and had an anxiety attack. 20 G PIV in LAC, got 5 mg Versed en route. Patient arrived in restraints.

## 2018-06-13 NOTE — ED Notes (Signed)
Bed: FG18 Expected date:  Expected time:  Means of arrival:  Comments: EMS F Combative

## 2018-06-21 ENCOUNTER — Other Ambulatory Visit: Payer: Self-pay

## 2018-06-21 ENCOUNTER — Emergency Department (HOSPITAL_COMMUNITY)
Admission: EM | Admit: 2018-06-21 | Discharge: 2018-06-21 | Disposition: A | Discharge status: Home / Self Care | Payer: Medicaid Other | Attending: Emergency Medicine | Admitting: Emergency Medicine

## 2018-06-21 ENCOUNTER — Encounter (HOSPITAL_COMMUNITY): Payer: Self-pay

## 2018-06-21 DIAGNOSIS — Y999 Unspecified external cause status: Secondary | ICD-10-CM | POA: Insufficient documentation

## 2018-06-21 DIAGNOSIS — Y9389 Activity, other specified: Secondary | ICD-10-CM | POA: Insufficient documentation

## 2018-06-21 DIAGNOSIS — Y929 Unspecified place or not applicable: Secondary | ICD-10-CM | POA: Diagnosis not present

## 2018-06-21 DIAGNOSIS — Z91040 Latex allergy status: Secondary | ICD-10-CM | POA: Diagnosis not present

## 2018-06-21 DIAGNOSIS — F908 Attention-deficit hyperactivity disorder, other type: Secondary | ICD-10-CM | POA: Diagnosis not present

## 2018-06-21 DIAGNOSIS — J45909 Unspecified asthma, uncomplicated: Secondary | ICD-10-CM | POA: Diagnosis not present

## 2018-06-21 DIAGNOSIS — F17210 Nicotine dependence, cigarettes, uncomplicated: Secondary | ICD-10-CM | POA: Insufficient documentation

## 2018-06-21 DIAGNOSIS — S51811A Laceration without foreign body of right forearm, initial encounter: Secondary | ICD-10-CM | POA: Diagnosis not present

## 2018-06-21 DIAGNOSIS — W25XXXA Contact with sharp glass, initial encounter: Secondary | ICD-10-CM | POA: Insufficient documentation

## 2018-06-21 DIAGNOSIS — Z79899 Other long term (current) drug therapy: Secondary | ICD-10-CM | POA: Diagnosis not present

## 2018-06-21 MED ORDER — CEPHALEXIN 500 MG PO CAPS: 500.0000 mg | ORAL_CAPSULE | Freq: Four times a day (QID) | ORAL | 0 refills | Status: DC

## 2018-06-21 MED ORDER — LIDOCAINE-EPINEPHRINE (PF) 2 %-1:200000 IJ SOLN
20.0000 mL | Freq: Once | INTRAMUSCULAR | Status: AC
  Administered 2018-06-21: 20 mL via INTRADERMAL
  Filled 2018-06-21: qty 20

## 2018-06-21 NOTE — Discharge Instructions (Signed)
Keep area clean by washing with soap and water daily. Apply a bandage at least once daily, change more often if it is dirty Take Keflex 4 times a day for 5 days to prevent infection Watch for signs of infection (redness, drainage, worsening pain) The stitches are absorbable (they will dissolve in several weeks)

## 2018-06-21 NOTE — ED Provider Notes (Signed)
[facility]-EMERGENCY DEPT Provider Note   CSN: [id number] Arrival date & time: 06/21/18  1531    History   Chief Complaint Chief Complaint  Patient presents with  . Laceration    HPI Ashley Carlson is a 18 y.o. female who presents with a right forearm laceration. PMH significant for ADHD, alcohol use disorder, anxiety, asthma, tobacco use disorder, cocaine use disorder, and cyclothymia. She states she cut her R forearm on glass yesterday while playing with her nephew. She applied pressure and a dressing and waited till today to come in but will not elaborate why or go in to further details regarding the injury. She denies weakness of the wrist or numbness/tingling. She states she is UTD on tetanus. Bleeding is controlled.   HPI  Past Medical History:  Diagnosis Date  . ADHD (attention deficit hyperactivity disorder)    pt. takes vyvanse  . Allergy    Environmental allergies, does not take meds  . Anxiety   . Asthma    uses albuterol inhaler PRN  . Depression   . Headache(784.0)   . Heart murmur    Does not require prophylaxis  . Seizures (HCC)    reports h/o seizures when she was younger but stopped at age 89yo.    . 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 Attention deficit hyperactivity disorder, hyperactive-impulsive type 08/27/2011  . Polysubstance abuse (HCC) 08/27/2011    History reviewed. No pertinent surgical history.   OB History    Gravida  0   Para  0   Term  0   Preterm  0   AB  0   Living  0     SAB  0   TAB  0   Ectopic  0   Multiple  0   Live Births               Home Medications    Prior to Admission medications   Medication Sig Start Date End Date Taking? Authorizing Provider  albuterol (PROVENTIL HFA;VENTOLIN HFA) 108 (90 Base) MCG/ACT  inhaler Inhale 1-2 puffs into the lungs every 6 (six) hours as needed for wheezing or shortness of breath. 11/29/17   Hedges, Tinnie GensJeffrey, PA-C  albuterol (PROVENTIL) (2.5 MG/3ML) 0.083% nebulizer solution Take 3 mLs (2.5 mg total) by nebulization every 4 (four) hours as needed for wheezing or shortness of breath. 02/12/14   Dione BoozeGlick, David, MD  potassium chloride SA (K-DUR) 20 MEQ tablet Take 1 tablet (20 mEq total) by mouth 2 (two) times daily for 5 days. 06/13/18 06/18/18  McDonald, Coral ElseMia A, PA-C    Family History Family History  Problem Relation Age of Onset  . Depression Paternal Grandmother        pt. is vague regarding FMhx   . Drug abuse Cousin        pt. is vague regarding FMHx    Social History Social History   Tobacco Use  . Smoking status: Current Every Day Smoker  . Smokeless tobacco: Never Used  Substance Use Topics  . Alcohol use: Yes    Comment: daily  . Drug use: No     Allergies   Aspirin; Wheat bran; Eggs or egg-derived products; Latex; and Milk-related compounds   Review of Systems Review of Systems  Musculoskeletal: Negative for arthralgias.  Skin:  Positive for wound.  All other systems reviewed and are negative.   Physical Exam Updated Vital Signs BP 108/73   Pulse 77   Temp 98.7 F (37.1 C) (Oral)   Resp 14   Ht 5\' 5"  (1.651 m)   Wt 56.7 kg   SpO2 100%   BMI 20.80 kg/m   Physical Exam Vitals signs and nursing note reviewed.  Constitutional:      General: She is not in acute distress.    Appearance: Normal appearance. She is well-developed.     Comments: Texting on phone constantly. Poor eye contact  HENT:     Head: Normocephalic and atraumatic.  Eyes:     General: No scleral icterus.       Right eye: No discharge.        Left eye: No discharge.     Conjunctiva/sclera: Conjunctivae normal.     Pupils: Pupils are equal, round, and reactive to light.  Neck:     Musculoskeletal: Normal range of motion.  Cardiovascular:     Rate and Rhythm:  Normal rate and regular rhythm.  Pulmonary:     Effort: Pulmonary effort is normal. No respiratory distress.     Breath sounds: Normal breath sounds.  Abdominal:     General: There is no distension.     Palpations: Abdomen is soft.     Tenderness: There is no abdominal tenderness.  Musculoskeletal:     Comments: 4cm laceration over anterior R forearm with subcutaneous tissue exposed. Bleeding is controlled. Swelling of wrist and hand. 2+ radial pulse. Normal ROM of wrist  Skin:    General: Skin is warm and dry.  Neurological:     Mental Status: She is alert and oriented to person, place, and time.  Psychiatric:        Attention and Perception: She is inattentive.        Mood and Affect: Mood normal. Affect is inappropriate.        Speech: Speech normal.        Behavior: Behavior is cooperative.      ED Treatments / Results  Labs (all labs ordered are listed, but only abnormal results are displayed) Labs Reviewed - No data to display  EKG None  Radiology No results found.  Procedures .Marland KitchenLaceration Repair Date/Time: 06/21/2018 4:57 PM Performed by: Bethel Born, PA-C Authorized by: Bethel Born, PA-C   Consent:    Consent obtained:  Verbal   Consent given by:  Patient   Risks discussed:  Infection and pain   Alternatives discussed:  No treatment Anesthesia (see MAR for exact dosages):    Anesthesia method:  Local infiltration   Local anesthetic:  Lidocaine 2% WITH epi Laceration details:    Location:  Shoulder/arm   Shoulder/arm location:  R lower arm   Length (cm):  4   Depth (mm):  10 Repair type:    Repair type:  Simple Pre-procedure details:    Preparation:  Patient was prepped and draped in usual sterile fashion Exploration:    Wound exploration: wound explored through full range of motion and entire depth of wound probed and visualized     Wound extent: no foreign bodies/material noted, no tendon damage noted and no vascular damage noted    Treatment:    Area cleansed with:  Shur-Clens   Amount of cleaning:  Standard   Irrigation method:  Tap   Visualized foreign bodies/material removed: no   Skin repair:    Repair method:  Sutures   Suture size:  5-0   Wound skin closure material used: Vicryl rapide.   Suture technique:  Simple interrupted   Number of sutures:  2 Approximation:    Approximation:  Loose Post-procedure details:    Dressing:  Sterile dressing   Patient tolerance of procedure:  Tolerated well, no immediate complications   (including critical care time)    Medications Ordered in ED Medications  lidocaine-EPINEPHrine (XYLOCAINE W/EPI) 2 %-1:200000 (PF) injection 20 mL (20 mLs Intradermal Given by Other 06/21/18 1648)     Initial Impression / Assessment and Plan / ED Course  I have reviewed the triage vital signs and the nursing notes.  Pertinent labs & imaging results that were available during my care of the patient were reviewed by me and considered in my medical decision making (see chart for details).  18 year old female presents with a forearm laceration that is >24 hours old. The laceration is large and deep. Bleeding is controlled. She is neurovascularly intact. Since wound is relatively large, I feel that she would benefit from loose closure. Wound was irritated thoroughtly. 2 absorbable sutures were placed. She was given Keflex rx for prophylaxis. Return precautions discussed.   Final Clinical Impressions(s) / ED Diagnoses   Final diagnoses:  Laceration of right forearm, initial encounter    ED Discharge Orders    None       Bethel Born, PA-C 06/21/18 1704    Virgina Norfolk, DO 06/27/18 [phone]

## 2018-06-21 NOTE — ED Triage Notes (Signed)
States was playing with nephew yesterday and cut right forearm on glass no active bleeding at this time states its a bad cut.

## 2018-06-21 NOTE — ED Notes (Signed)
ED Provider at bedside. 

## 2019-04-25 ENCOUNTER — Emergency Department (HOSPITAL_COMMUNITY)
Admission: EM | Admit: 2019-04-25 | Discharge: 2019-04-26 | Disposition: A | Discharge status: Home / Self Care | Payer: Medicaid Other | Attending: Emergency Medicine | Admitting: Emergency Medicine

## 2019-04-25 ENCOUNTER — Other Ambulatory Visit: Payer: Self-pay

## 2019-04-25 ENCOUNTER — Encounter (HOSPITAL_COMMUNITY): Payer: Self-pay

## 2019-04-25 DIAGNOSIS — J45909 Unspecified asthma, uncomplicated: Secondary | ICD-10-CM | POA: Diagnosis not present

## 2019-04-25 DIAGNOSIS — Z98818 Other dental procedure status: Secondary | ICD-10-CM | POA: Diagnosis not present

## 2019-04-25 DIAGNOSIS — R110 Nausea: Secondary | ICD-10-CM

## 2019-04-25 DIAGNOSIS — F909 Attention-deficit hyperactivity disorder, unspecified type: Secondary | ICD-10-CM | POA: Insufficient documentation

## 2019-04-25 DIAGNOSIS — G44209 Tension-type headache, unspecified, not intractable: Secondary | ICD-10-CM

## 2019-04-25 DIAGNOSIS — R42 Dizziness and giddiness: Secondary | ICD-10-CM | POA: Diagnosis present

## 2019-04-25 DIAGNOSIS — Z91040 Latex allergy status: Secondary | ICD-10-CM | POA: Diagnosis not present

## 2019-04-25 DIAGNOSIS — R112 Nausea with vomiting, unspecified: Secondary | ICD-10-CM | POA: Diagnosis not present

## 2019-04-25 DIAGNOSIS — F17210 Nicotine dependence, cigarettes, uncomplicated: Secondary | ICD-10-CM | POA: Diagnosis not present

## 2019-04-25 DIAGNOSIS — Z79899 Other long term (current) drug therapy: Secondary | ICD-10-CM | POA: Diagnosis not present

## 2019-04-25 DIAGNOSIS — L309 Dermatitis, unspecified: Secondary | ICD-10-CM | POA: Diagnosis not present

## 2019-04-25 NOTE — ED Triage Notes (Signed)
Pt reports that she had her wisdom teeth removed 2 weeks ago. Starting Monday, pt reports that she experienced a sore throat, nasal congestion, chills headache, N/V/D. Pt denies loss of taste or smell, CP, SHOB, or fever. Pt A&Ox4 and ambulatory in triage.

## 2019-04-25 NOTE — ED Provider Notes (Signed)
[facility]-EMERGENCY DEPT Provider Note   CSN: [id number] Arrival date & time: 04/25/19  2100     History Chief Complaint  Patient presents with  . Flu-Like Symptoms    Ashley Carlson is a 19 y.o. female.  Ashley Carlson is a 19 y.o. female who presents with multiple complaints. PMH significant for ADHD, alcohol use disorder, anxiety, asthma, tobacco use disorder, cocaine use disorder, and cyclothymia. She states that she had her wisdom teeth removed at [facility] about 10 days ago. She has been taking penicillin and hydrocodone since the operation and notes that her pain worsened so she went to see her oral surgeon two days ago when gave her additional penicillin and hydrocodone and noted that "she was healing well". She states that she feels intermittent lightheadedness, subjective fevers, chills, sore throat, voice hoarseness, n/v/d, HA and loss of taste. She states her oral surgeon noted that she was given mouthwash s/p the procedure and that this could cause her loss of taste. She is unsure of any sick contacts. She states she vomited one time this morning and "ate some chicken nuggets today" and has not vomited since. She smokes 2 PPD and has continued smoking since her operation. She denies discharge from the oral cavity, trismus, difficulty swallowing, drooling.   Pt also notes a history of eczema. She has mild itching and redness on the right lateral elbow that she states is consistent with prior eczema exacerbations. She requests hydrocortisone cream. No drainage from the site.        Past Medical History:  Diagnosis Date  . ADHD (attention deficit hyperactivity disorder)    pt. takes vyvanse  . Allergy    Environmental allergies, does not take meds  . Anxiety   . Asthma    uses albuterol inhaler PRN  . Depression   . Headache(784.0)   . Heart murmur    Does not require prophylaxis  . Seizures (HCC)    reports h/o seizures when she  was younger but stopped at age 30yo.    . 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 Attention deficit hyperactivity disorder, hyperactive-impulsive type 08/27/2011  . Polysubstance abuse (HCC) 08/27/2011    History reviewed. No pertinent surgical history.   OB History    Gravida  0   Para  0   Term  0   Preterm  0   AB  0   Living  0     SAB  0   TAB  0   Ectopic  0   Multiple  0   Live Births              Family History  Problem Relation Age of Onset  . Depression Paternal Grandmother        pt. is vague regarding FMhx   . Drug abuse Cousin        pt. is vague regarding FMHx    Social History   Tobacco Use  . Smoking status: Current Every Day Smoker  . Smokeless tobacco: Never Used  Substance Use Topics  . Alcohol use: Yes    Comment: daily  . Drug use: No    Home Medications Prior to Admission medications   Medication Sig Start Date End Date Taking? Authorizing Provider  albuterol (PROVENTIL HFA;VENTOLIN  HFA) 108 (90 Base) MCG/ACT inhaler Inhale 1-2 puffs into the lungs every 6 (six) hours as needed for wheezing or shortness of breath. 11/29/17   Hedges, Dellis Filbert, PA-C  albuterol (PROVENTIL) (2.5 MG/3ML) 0.083% nebulizer solution Take 3 mLs (2.5 mg total) by nebulization every 4 (four) hours as needed for wheezing or shortness of breath. 95/28/41   Delora Fuel, MD  cephALEXin (KEFLEX) 500 MG capsule Take 1 capsule (500 mg total) by mouth 4 (four) times daily. 06/21/18   Recardo Evangelist, PA-C  potassium chloride SA (K-DUR) 20 MEQ tablet Take 1 tablet (20 mEq total) by mouth 2 (two) times daily for 5 days. 06/13/18 06/18/18  McDonald, Mia A, PA-C    Allergies    Aspirin, Wheat bran, Eggs or egg-derived products, Latex, and Milk-related compounds  Review of Systems   Review of Systems    Constitutional: Positive for appetite change, chills and fever (subjective).  HENT: Positive for congestion, dental problem, sore throat and voice change. Negative for drooling, facial swelling and trouble swallowing.   Respiratory: Positive for shortness of breath.   Gastrointestinal: Positive for diarrhea, nausea and vomiting. Negative for abdominal pain and constipation.  Skin: Positive for color change, rash and wound.  Neurological: Positive for light-headedness and headaches. Negative for dizziness.  All other systems reviewed and are negative.   Physical Exam Updated Vital Signs BP 121/73 (BP Location: Left Arm)   Pulse 88   Temp 98.5 F (36.9 C)   Resp 15   Ht 5\' 5"  (1.651 m)   Wt 63.5 kg   LMP 04/04/2019   SpO2 100%   BMI 23.30 kg/m   Physical Exam Vitals and nursing note reviewed.  Constitutional:      General: She is not in acute distress.    Appearance: Normal appearance. She is normal weight. She is not ill-appearing, toxic-appearing or diaphoretic.  HENT:     Head: Normocephalic.     Right Ear: External ear normal.     Left Ear: External ear normal.     Nose: Nose normal. No congestion or rhinorrhea.     Mouth/Throat:     Mouth: Mucous membranes are moist.     Pharynx: No oropharyngeal exudate or posterior oropharyngeal erythema.     Comments: No peritonsillar swelling. No posterior oropharyngeal erythema or edema. Wisdom teeth sites without signs of erythema, edema, or discharge. No drooling. No trismus. Pt able to readily handle secretions.   Moderate TTP noted in the right lateral submandibular region. No erythema.  Cardiovascular:     Rate and Rhythm: Normal rate and regular rhythm.     Pulses: Normal pulses.     Heart sounds: Normal heart sounds.  Pulmonary:     Effort: Pulmonary effort is normal. No respiratory distress.     Breath sounds: No stridor. No rhonchi or rales.     Comments: Adventitious lung sounds noted diffusely in the posterior lung  fields consistent with smoking history  Abdominal:     General: Abdomen is flat.     Palpations: Abdomen is soft.     Tenderness: There is no abdominal tenderness.  Musculoskeletal:     Cervical back: Normal range of motion and neck supple. No rigidity or tenderness.  Lymphadenopathy:     Cervical: No cervical adenopathy.  Skin:    General: Skin is warm and dry.     Capillary Refill: Capillary refill takes less than 2 seconds.     Findings: Erythema and rash present.  Comments: Diffuse excoriations with mild erythema noted to the right lateral elbow.   Neurological:     General: No focal deficit present.     Mental Status: She is alert and oriented to person, place, and time.  Psychiatric:        Mood and Affect: Mood normal.        Behavior: Behavior normal.    ED Results / Procedures / Treatments   Labs (all labs ordered are listed, but only abnormal results are displayed) Labs Reviewed - No data to display  EKG None  Radiology No results found.  Procedures Procedures (including critical care time)  Medications Ordered in ED Medications - No data to display  ED Course  I have reviewed the triage vital signs and the nursing notes.  Pertinent labs & imaging results that were available during my care of the patient were reviewed by me and considered in my medical decision making (see chart for details).    MDM Rules/Calculators/A&P                      12:14 AM Pt is a 19 year old AAF that presents 10 days s/p wisdom tooth extraction. She has multiple complaints but focuses most on her HA, right submandibular pain, and nausea/vomiting. Her HEENT exam was benign and no signs of infection were present. Oral wounds are well healing. Pt is a 2PPD smoker and faint wheezing was present. O2 saturations 100% during exam. Will give Zofran for nausea and Toradol for HA. Will fluid challenge and reassess.   1:03 AM Pt states she is feeling much better. Will give her  hydrocortisone for eczema. Will d/c. Pt made aware to follow up with oral surgeon as needed. Discussed with pt to return with any new or worsening sx. She verbalized understanding. VSS at the time of discharge.   Final Clinical Impression(s) / ED Diagnoses Final diagnoses:  Nausea  Tension headache  Eczema, unspecified type    Rx / DC Orders ED Discharge Orders         Ordered    hydrocortisone cream 1 %  2 times daily     04/26/19 0108           Placido Sou, PA-C 04/26/19 3546    Ward, Layla Maw, DO 04/26/19 [id number]

## 2019-04-26 MED ORDER — HYDROCORTISONE 1 % EX CREA: TOPICAL_CREAM | Freq: Two times a day (BID) | CUTANEOUS | 0 refills | Status: DC

## 2019-04-26 MED ORDER — KETOROLAC TROMETHAMINE 60 MG/2ML IM SOLN
60.0000 mg | Freq: Once | INTRAMUSCULAR | Status: AC
  Administered 2019-04-26: 01:00:00 60 mg via INTRAMUSCULAR
  Filled 2019-04-26: qty 2

## 2019-04-26 MED ORDER — ONDANSETRON 8 MG PO TBDP
8.0000 mg | ORAL_TABLET | Freq: Once | ORAL | Status: AC
  Administered 2019-04-26: 8 mg via ORAL
  Filled 2019-04-26: qty 1

## 2019-04-26 NOTE — ED Notes (Signed)
Patient was given food and drinks per PA.

## 2019-06-12 ENCOUNTER — Emergency Department (HOSPITAL_COMMUNITY): Payer: Medicaid Other

## 2019-06-12 ENCOUNTER — Emergency Department (HOSPITAL_COMMUNITY)
Admission: EM | Admit: 2019-06-12 | Discharge: 2019-06-12 | Disposition: A | Discharge status: Home / Self Care | Payer: Medicaid Other | Attending: Emergency Medicine | Admitting: Emergency Medicine

## 2019-06-12 ENCOUNTER — Other Ambulatory Visit: Payer: Self-pay

## 2019-06-12 ENCOUNTER — Encounter (HOSPITAL_COMMUNITY): Payer: Self-pay

## 2019-06-12 DIAGNOSIS — S92902A Unspecified fracture of left foot, initial encounter for closed fracture: Secondary | ICD-10-CM

## 2019-06-12 DIAGNOSIS — T07XXXA Unspecified multiple injuries, initial encounter: Secondary | ICD-10-CM

## 2019-06-12 DIAGNOSIS — Z79899 Other long term (current) drug therapy: Secondary | ICD-10-CM | POA: Insufficient documentation

## 2019-06-12 DIAGNOSIS — R001 Bradycardia, unspecified: Secondary | ICD-10-CM | POA: Diagnosis not present

## 2019-06-12 DIAGNOSIS — F419 Anxiety disorder, unspecified: Secondary | ICD-10-CM | POA: Insufficient documentation

## 2019-06-12 DIAGNOSIS — S20419A Abrasion of unspecified back wall of thorax, initial encounter: Secondary | ICD-10-CM | POA: Insufficient documentation

## 2019-06-12 DIAGNOSIS — Y999 Unspecified external cause status: Secondary | ICD-10-CM | POA: Diagnosis not present

## 2019-06-12 DIAGNOSIS — S199XXA Unspecified injury of neck, initial encounter: Secondary | ICD-10-CM | POA: Diagnosis not present

## 2019-06-12 DIAGNOSIS — R55 Syncope and collapse: Secondary | ICD-10-CM | POA: Diagnosis not present

## 2019-06-12 DIAGNOSIS — S40212A Abrasion of left shoulder, initial encounter: Secondary | ICD-10-CM | POA: Diagnosis not present

## 2019-06-12 DIAGNOSIS — R9431 Abnormal electrocardiogram [ECG] [EKG]: Secondary | ICD-10-CM | POA: Diagnosis not present

## 2019-06-12 DIAGNOSIS — S3991XA Unspecified injury of abdomen, initial encounter: Secondary | ICD-10-CM | POA: Diagnosis not present

## 2019-06-12 DIAGNOSIS — S0990XA Unspecified injury of head, initial encounter: Secondary | ICD-10-CM | POA: Diagnosis not present

## 2019-06-12 DIAGNOSIS — S92352A Displaced fracture of fifth metatarsal bone, left foot, initial encounter for closed fracture: Secondary | ICD-10-CM | POA: Insufficient documentation

## 2019-06-12 DIAGNOSIS — R000 Tachycardia, unspecified: Secondary | ICD-10-CM | POA: Insufficient documentation

## 2019-06-12 DIAGNOSIS — Z91040 Latex allergy status: Secondary | ICD-10-CM | POA: Diagnosis not present

## 2019-06-12 DIAGNOSIS — Y9389 Activity, other specified: Secondary | ICD-10-CM | POA: Insufficient documentation

## 2019-06-12 DIAGNOSIS — J45909 Unspecified asthma, uncomplicated: Secondary | ICD-10-CM | POA: Diagnosis not present

## 2019-06-12 DIAGNOSIS — S90512A Abrasion, left ankle, initial encounter: Secondary | ICD-10-CM | POA: Diagnosis not present

## 2019-06-12 DIAGNOSIS — R519 Headache, unspecified: Secondary | ICD-10-CM | POA: Diagnosis not present

## 2019-06-12 DIAGNOSIS — S8992XA Unspecified injury of left lower leg, initial encounter: Secondary | ICD-10-CM | POA: Diagnosis not present

## 2019-06-12 DIAGNOSIS — S50312A Abrasion of left elbow, initial encounter: Secondary | ICD-10-CM | POA: Diagnosis not present

## 2019-06-12 DIAGNOSIS — S80212A Abrasion, left knee, initial encounter: Secondary | ICD-10-CM | POA: Diagnosis not present

## 2019-06-12 DIAGNOSIS — R102 Pelvic and perineal pain: Secondary | ICD-10-CM | POA: Diagnosis not present

## 2019-06-12 DIAGNOSIS — S30810A Abrasion of lower back and pelvis, initial encounter: Secondary | ICD-10-CM | POA: Diagnosis not present

## 2019-06-12 DIAGNOSIS — S3993XA Unspecified injury of pelvis, initial encounter: Secondary | ICD-10-CM | POA: Diagnosis not present

## 2019-06-12 DIAGNOSIS — R52 Pain, unspecified: Secondary | ICD-10-CM | POA: Diagnosis not present

## 2019-06-12 DIAGNOSIS — S80812A Abrasion, left lower leg, initial encounter: Secondary | ICD-10-CM | POA: Diagnosis not present

## 2019-06-12 DIAGNOSIS — F17200 Nicotine dependence, unspecified, uncomplicated: Secondary | ICD-10-CM | POA: Insufficient documentation

## 2019-06-12 DIAGNOSIS — Y92410 Unspecified street and highway as the place of occurrence of the external cause: Secondary | ICD-10-CM | POA: Diagnosis not present

## 2019-06-12 DIAGNOSIS — S299XXA Unspecified injury of thorax, initial encounter: Secondary | ICD-10-CM | POA: Diagnosis not present

## 2019-06-12 DIAGNOSIS — S99922A Unspecified injury of left foot, initial encounter: Secondary | ICD-10-CM | POA: Diagnosis present

## 2019-06-12 LAB — COMPREHENSIVE METABOLIC PANEL
ALT: 17 U/L (ref 0–44)
AST: 25 U/L (ref 15–41)
Albumin: 3.6 g/dL (ref 3.5–5.0)
Alkaline Phosphatase: 54 U/L (ref 38–126)
Anion gap: 10 (ref 5–15)
BUN: 5 mg/dL — ABNORMAL LOW (ref 6–20)
CO2: 20 mmol/L — ABNORMAL LOW (ref 22–32)
Calcium: 8.7 mg/dL — ABNORMAL LOW (ref 8.9–10.3)
Chloride: 106 mmol/L (ref 98–111)
Creatinine, Ser: 0.85 mg/dL (ref 0.44–1.00)
GFR calc Af Amer: >60 mL/min (ref >60)
GFR calc non Af Amer: >60 mL/min (ref >60)
Glucose, Bld: 116 mg/dL — ABNORMAL HIGH (ref 70–99)
Potassium: 3.3 mmol/L — ABNORMAL LOW (ref 3.5–5.1)
Sodium: 136 mmol/L (ref 135–145)
Total Bilirubin: 0.3 mg/dL (ref 0.3–1.2)
Total Protein: 6.6 g/dL (ref 6.5–8.1)

## 2019-06-12 LAB — RAPID URINE DRUG SCREEN, HOSP PERFORMED
Amphetamines: NOT DETECTED
Barbiturates: NOT DETECTED
Benzodiazepines: NOT DETECTED
Cocaine: NOT DETECTED
Opiates: NOT DETECTED
Tetrahydrocannabinol: POSITIVE — AB

## 2019-06-12 LAB — I-STAT CHEM 8, ED
BUN: 3 mg/dL — ABNORMAL LOW (ref 6–20)
Calcium, Ion: 1.02 mmol/L — ABNORMAL LOW (ref 1.15–1.40)
Chloride: 103 mmol/L (ref 98–111)
Creatinine, Ser: 0.8 mg/dL (ref 0.44–1.00)
Glucose, Bld: 110 mg/dL — ABNORMAL HIGH (ref 70–99)
HCT: 37 % (ref 36.0–46.0)
Hemoglobin: 12.6 g/dL (ref 12.0–15.0)
Potassium: 3.3 mmol/L — ABNORMAL LOW (ref 3.5–5.1)
Sodium: 138 mmol/L (ref 135–145)
TCO2: 23 mmol/L (ref 22–32)

## 2019-06-12 LAB — URINALYSIS, ROUTINE W REFLEX MICROSCOPIC
Bilirubin Urine: NEGATIVE
Glucose, UA: NEGATIVE mg/dL
Hgb urine dipstick: NEGATIVE
Ketones, ur: NEGATIVE mg/dL
Leukocytes,Ua: NEGATIVE
Nitrite: NEGATIVE
Protein, ur: NEGATIVE mg/dL
Specific Gravity, Urine: >1.046 — ABNORMAL HIGH (ref 1.005–1.030)
pH: 6 (ref 5.0–8.0)

## 2019-06-12 LAB — CBC
HCT: 36.8 % (ref 36.0–46.0)
Hemoglobin: 11.6 g/dL — ABNORMAL LOW (ref 12.0–15.0)
MCH: 30.7 pg (ref 26.0–34.0)
MCHC: 31.5 g/dL (ref 30.0–36.0)
MCV: 97.4 fL (ref 80.0–100.0)
Platelets: 281 10*3/uL (ref 150–400)
RBC: 3.78 MIL/uL — ABNORMAL LOW (ref 3.87–5.11)
RDW: 13.9 % (ref 11.5–15.5)
WBC: 4.3 10*3/uL (ref 4.0–10.5)
nRBC: 0 % (ref 0.0–0.2)

## 2019-06-12 LAB — SAMPLE TO BLOOD BANK

## 2019-06-12 LAB — I-STAT BETA HCG BLOOD, ED (MC, WL, AP ONLY): I-stat hCG, quantitative: <5 m[IU]/mL (ref <5)

## 2019-06-12 LAB — PROTIME-INR
INR: 1 (ref 0.8–1.2)
Prothrombin Time: 13.5 seconds (ref 11.4–15.2)

## 2019-06-12 LAB — ETHANOL: Alcohol, Ethyl (B): 63 mg/dL — ABNORMAL HIGH (ref <10)

## 2019-06-12 LAB — CDS SEROLOGY

## 2019-06-12 MED ORDER — IOHEXOL 300 MG/ML  SOLN
100.0000 mL | Freq: Once | INTRAMUSCULAR | Status: AC | PRN
  Administered 2019-06-12: 100 mL via INTRAVENOUS

## 2019-06-12 MED ORDER — HYDROCODONE-ACETAMINOPHEN 5-325 MG PO TABS: 1.0000 | ORAL_TABLET | Freq: Four times a day (QID) | ORAL | 0 refills | Status: DC | PRN

## 2019-06-12 MED ORDER — SODIUM CHLORIDE 0.9 % IV SOLN: Freq: Once | INTRAVENOUS | Status: AC

## 2019-06-12 MED ORDER — BACITRACIN ZINC 500 UNIT/GM EX OINT: 1.0000 "application " | TOPICAL_OINTMENT | Freq: Two times a day (BID) | CUTANEOUS | 0 refills | Status: DC

## 2019-06-12 MED ORDER — IBUPROFEN 800 MG PO TABS: 800.0000 mg | ORAL_TABLET | Freq: Three times a day (TID) | ORAL | 0 refills | Status: DC

## 2019-06-12 MED ORDER — IBUPROFEN 800 MG PO TABS
800.0000 mg | ORAL_TABLET | Freq: Once | ORAL | Status: AC
  Administered 2019-06-12: 800 mg via ORAL
  Filled 2019-06-12: qty 1

## 2019-06-12 MED ORDER — FENTANYL CITRATE (PF) 100 MCG/2ML IJ SOLN
100.0000 ug | INTRAMUSCULAR | Status: DC | PRN
  Administered 2019-06-12 (×3): 100 ug via INTRAVENOUS
  Filled 2019-06-12 (×3): qty 2

## 2019-06-12 MED ORDER — HYDROCODONE-ACETAMINOPHEN 5-325 MG PO TABS
1.0000 | ORAL_TABLET | Freq: Once | ORAL | Status: AC
  Administered 2019-06-12: 1 via ORAL
  Filled 2019-06-12: qty 1

## 2019-06-12 NOTE — Progress Notes (Signed)
Orthopedic Tech Progress Note Patient Details:  [name] 05-Nov-2000 [id number]  Ortho Devices Type of Ortho Device: Crutches, CAM walker Ortho Device/Splint Location: LLE Ortho Device/Splint Interventions: Application   Post Interventions Patient Tolerated: Well Instructions Provided: Care of device, Adjustment of device   [name] [name] 06/12/2019, 11:05 PM
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
## 2019-06-12 NOTE — Discharge Instructions (Addendum)
1.  Take ibuprofen 800 mg 3 times a day for pain.  Eat a small meal with the tablet.  You may also take 1 to 2 tablets of Vicodin every 6 hours if needed for additional significant pain. 2.  Wear your cam boot and use crutches until you see your doctor in follow-up.  Elevate your left foot is much as possible and apply well wrapped ice pack to areas of swelling. 3.  Keep antibiotic ointment on your abrasions. 4.  Return to the emergency department if you have worsening symptoms

## 2019-06-13 ENCOUNTER — Encounter (HOSPITAL_COMMUNITY): Payer: Self-pay
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
## 2019-06-16 ENCOUNTER — Emergency Department (HOSPITAL_COMMUNITY)
Admission: EM | Admit: 2019-06-16 | Discharge: 2019-06-17 | Disposition: A | Discharge status: Home / Self Care | Payer: Medicaid Other | Attending: Emergency Medicine | Admitting: Emergency Medicine

## 2019-06-16 ENCOUNTER — Encounter (HOSPITAL_COMMUNITY): Payer: Self-pay

## 2019-06-16 ENCOUNTER — Other Ambulatory Visit: Payer: Self-pay

## 2019-06-16 DIAGNOSIS — F17210 Nicotine dependence, cigarettes, uncomplicated: Secondary | ICD-10-CM | POA: Diagnosis not present

## 2019-06-16 DIAGNOSIS — J45909 Unspecified asthma, uncomplicated: Secondary | ICD-10-CM | POA: Diagnosis not present

## 2019-06-16 DIAGNOSIS — S30810A Abrasion of lower back and pelvis, initial encounter: Secondary | ICD-10-CM | POA: Diagnosis not present

## 2019-06-16 DIAGNOSIS — S40212A Abrasion of left shoulder, initial encounter: Secondary | ICD-10-CM | POA: Diagnosis not present

## 2019-06-16 DIAGNOSIS — F0781 Postconcussional syndrome: Secondary | ICD-10-CM | POA: Insufficient documentation

## 2019-06-16 DIAGNOSIS — T07XXXA Unspecified multiple injuries, initial encounter: Secondary | ICD-10-CM

## 2019-06-16 DIAGNOSIS — Y999 Unspecified external cause status: Secondary | ICD-10-CM | POA: Insufficient documentation

## 2019-06-16 DIAGNOSIS — R519 Headache, unspecified: Secondary | ICD-10-CM | POA: Diagnosis not present

## 2019-06-16 DIAGNOSIS — Z79899 Other long term (current) drug therapy: Secondary | ICD-10-CM | POA: Diagnosis not present

## 2019-06-16 DIAGNOSIS — Y939 Activity, unspecified: Secondary | ICD-10-CM | POA: Diagnosis not present

## 2019-06-16 DIAGNOSIS — S40211A Abrasion of right shoulder, initial encounter: Secondary | ICD-10-CM | POA: Diagnosis not present

## 2019-06-16 DIAGNOSIS — Y929 Unspecified place or not applicable: Secondary | ICD-10-CM | POA: Diagnosis not present
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
## 2019-06-17 MED ORDER — PROCHLORPERAZINE MALEATE 10 MG PO TABS
10.0000 mg | ORAL_TABLET | Freq: Once | ORAL | Status: DC
  Filled 2019-06-17: qty 1

## 2019-06-17 MED ORDER — DIPHENHYDRAMINE HCL 25 MG PO CAPS
50.0000 mg | ORAL_CAPSULE | Freq: Once | ORAL | Status: AC
  Administered 2019-06-17: 01:00:00 50 mg via ORAL
  Filled 2019-06-17: qty 2

## 2019-06-17 MED ORDER — PROCHLORPERAZINE MALEATE 10 MG PO TABS: 10.0000 mg | ORAL_TABLET | Freq: Once | ORAL | Status: DC

## 2019-06-17 MED ORDER — BACITRACIN ZINC 500 UNIT/GM EX OINT
TOPICAL_OINTMENT | CUTANEOUS | Status: AC
  Administered 2019-06-17: 2
  Filled 2019-06-17: qty 1.8

## 2019-06-17 MED ORDER — BACITRACIN ZINC 500 UNIT/GM EX OINT: 1.0000 "application " | TOPICAL_OINTMENT | Freq: Two times a day (BID) | CUTANEOUS | 0 refills | Status: DC

## 2019-06-17 MED ORDER — IBUPROFEN 800 MG PO TABS: 800.0000 mg | ORAL_TABLET | Freq: Three times a day (TID) | ORAL | 0 refills | Status: DC | PRN

## 2019-06-17 MED ORDER — BACITRACIN ZINC 500 UNIT/GM EX OINT
TOPICAL_OINTMENT | CUTANEOUS | Status: AC
  Administered 2019-06-17: 5
  Filled 2019-06-17: qty 4.5

## 2019-06-17 MED ORDER — PROCHLORPERAZINE EDISYLATE 10 MG/2ML IJ SOLN
10.0000 mg | Freq: Once | INTRAMUSCULAR | Status: DC
  Filled 2019-06-17: qty 2

## 2019-06-17 MED ORDER — METHOCARBAMOL 500 MG PO TABS: 500.0000 mg | ORAL_TABLET | Freq: Two times a day (BID) | ORAL | 0 refills | Status: DC

## 2019-06-17 NOTE — ED Notes (Signed)
Pt sitting up in bed. NAD noted. Friend at bedside. Pt ready for d/c

## 2019-06-17 NOTE — Discharge Instructions (Addendum)
1. Medications: Ibuprofen or Tylenol for pain; bacitracin for your road rash, Robaxin for muscle spasms 2. Treatment: Rest, ice on head.  Concussion precautions given - keep patient in a quiet, not simulating, dark environment. No TV, computer use, video games until headache is resolved completely. No contact sports until cleared by the pediatrician. 3. Follow Up: With primary care physician on Monday and neurology within 1 week.  Return to the emergency department if patient becomes lethargic, begins vomiting, develops double vision, speech difficulty, problems walking or other change in mental status.
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
## 2019-06-17 NOTE — ED Notes (Signed)
Pt lying in bed. Wounds dressed. Pt denies any other needs. Friend at bedside. Will continue to monitor.

## 2019-06-18 DIAGNOSIS — S92302A Fracture of unspecified metatarsal bone(s), left foot, initial encounter for closed fracture: Secondary | ICD-10-CM | POA: Diagnosis not present

## 2019-06-28 ENCOUNTER — Encounter: Payer: Self-pay | Admitting: Neurology

## 2019-06-28 ENCOUNTER — Ambulatory Visit: Payer: Medicaid Other | Admitting: Neurology

## 2019-07-11 ENCOUNTER — Emergency Department (HOSPITAL_COMMUNITY)
Admission: EM | Admit: 2019-07-11 | Discharge: 2019-07-11 | Disposition: A | Discharge status: Home / Self Care | Payer: Medicaid Other | Attending: Emergency Medicine | Admitting: Emergency Medicine

## 2019-07-11 ENCOUNTER — Encounter (HOSPITAL_COMMUNITY): Payer: Self-pay | Admitting: Emergency Medicine

## 2019-07-11 ENCOUNTER — Other Ambulatory Visit: Payer: Self-pay

## 2019-07-11 DIAGNOSIS — Z5321 Procedure and treatment not carried out due to patient leaving prior to being seen by health care provider: Secondary | ICD-10-CM | POA: Insufficient documentation

## 2019-07-11 DIAGNOSIS — R569 Unspecified convulsions: Secondary | ICD-10-CM | POA: Insufficient documentation

## 2019-07-11 DIAGNOSIS — R457 State of emotional shock and stress, unspecified: Secondary | ICD-10-CM | POA: Diagnosis not present

## 2019-07-11 DIAGNOSIS — R000 Tachycardia, unspecified: Secondary | ICD-10-CM | POA: Diagnosis not present

## 2019-07-11 LAB — BASIC METABOLIC PANEL
Anion gap: 11 (ref 5–15)
BUN: <5 mg/dL — ABNORMAL LOW (ref 6–20)
CO2: 22 mmol/L (ref 22–32)
Calcium: 9.4 mg/dL (ref 8.9–10.3)
Chloride: 105 mmol/L (ref 98–111)
Creatinine, Ser: 0.8 mg/dL (ref 0.44–1.00)
GFR calc Af Amer: >60 mL/min (ref >60)
GFR calc non Af Amer: >60 mL/min (ref >60)
Glucose, Bld: 95 mg/dL (ref 70–99)
Potassium: 3.6 mmol/L (ref 3.5–5.1)
Sodium: 138 mmol/L (ref 135–145)

## 2019-07-11 LAB — CBC
HCT: 40 % (ref 36.0–46.0)
Hemoglobin: 13.1 g/dL (ref 12.0–15.0)
MCH: 31 pg (ref 26.0–34.0)
MCHC: 32.8 g/dL (ref 30.0–36.0)
MCV: 94.8 fL (ref 80.0–100.0)
Platelets: 286 10*3/uL (ref 150–400)
RBC: 4.22 MIL/uL (ref 3.87–5.11)
RDW: 12.9 % (ref 11.5–15.5)
WBC: 7.3 10*3/uL (ref 4.0–10.5)
nRBC: 0 % (ref 0.0–0.2)

## 2019-07-11 LAB — I-STAT BETA HCG BLOOD, ED (MC, WL, AP ONLY): I-stat hCG, quantitative: <5 m[IU]/mL (ref <5)

## 2019-07-11 NOTE — ED Notes (Signed)
Pt sitting outside with two friends

## 2019-07-11 NOTE — ED Notes (Signed)
Ashley Carlson Mother [phone]

## 2019-07-11 NOTE — ED Notes (Signed)
Called for pt x3. Pt left AMA.
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
## 2019-07-30 NOTE — Telephone Encounter (Signed)
This is a 19 year old concern, I am unable to remember this is detail to add a follow up note now.

## 2019-08-01 ENCOUNTER — Emergency Department (HOSPITAL_COMMUNITY): Payer: Medicaid Other

## 2019-08-01 ENCOUNTER — Other Ambulatory Visit: Payer: Self-pay

## 2019-08-01 ENCOUNTER — Encounter (HOSPITAL_COMMUNITY): Payer: Self-pay | Admitting: Emergency Medicine

## 2019-08-01 ENCOUNTER — Emergency Department (HOSPITAL_COMMUNITY)
Admission: EM | Admit: 2019-08-01 | Discharge: 2019-08-01 | Disposition: A | Discharge status: Home / Self Care | Payer: Medicaid Other | Attending: Emergency Medicine | Admitting: Emergency Medicine

## 2019-08-01 DIAGNOSIS — Z91018 Allergy to other foods: Secondary | ICD-10-CM | POA: Insufficient documentation

## 2019-08-01 DIAGNOSIS — R404 Transient alteration of awareness: Secondary | ICD-10-CM | POA: Diagnosis not present

## 2019-08-01 DIAGNOSIS — F10920 Alcohol use, unspecified with intoxication, uncomplicated: Secondary | ICD-10-CM

## 2019-08-01 DIAGNOSIS — Z91040 Latex allergy status: Secondary | ICD-10-CM | POA: Insufficient documentation

## 2019-08-01 DIAGNOSIS — F17210 Nicotine dependence, cigarettes, uncomplicated: Secondary | ICD-10-CM | POA: Insufficient documentation

## 2019-08-01 DIAGNOSIS — R231 Pallor: Secondary | ICD-10-CM | POA: Diagnosis not present

## 2019-08-01 DIAGNOSIS — F10129 Alcohol abuse with intoxication, unspecified: Secondary | ICD-10-CM | POA: Diagnosis not present

## 2019-08-01 DIAGNOSIS — Z91011 Allergy to milk products: Secondary | ICD-10-CM | POA: Diagnosis not present

## 2019-08-01 DIAGNOSIS — F1910 Other psychoactive substance abuse, uncomplicated: Secondary | ICD-10-CM | POA: Insufficient documentation

## 2019-08-01 DIAGNOSIS — W19XXXA Unspecified fall, initial encounter: Secondary | ICD-10-CM | POA: Diagnosis not present

## 2019-08-01 DIAGNOSIS — R000 Tachycardia, unspecified: Secondary | ICD-10-CM | POA: Diagnosis not present

## 2019-08-01 DIAGNOSIS — J45909 Unspecified asthma, uncomplicated: Secondary | ICD-10-CM | POA: Insufficient documentation

## 2019-08-01 DIAGNOSIS — Z79899 Other long term (current) drug therapy: Secondary | ICD-10-CM | POA: Diagnosis not present

## 2019-08-01 DIAGNOSIS — R55 Syncope and collapse: Secondary | ICD-10-CM | POA: Diagnosis not present

## 2019-08-01 DIAGNOSIS — Z91012 Allergy to eggs: Secondary | ICD-10-CM | POA: Diagnosis not present

## 2019-08-01 DIAGNOSIS — R569 Unspecified convulsions: Secondary | ICD-10-CM | POA: Diagnosis not present

## 2019-08-01 LAB — CBC WITH DIFFERENTIAL/PLATELET
Abs Immature Granulocytes: 0 10*3/uL (ref 0.00–0.07)
Basophils Absolute: 0 10*3/uL (ref 0.0–0.1)
Basophils Relative: 1 %
Eosinophils Absolute: 0 10*3/uL (ref 0.0–0.5)
Eosinophils Relative: 1 %
HCT: 34.2 % — ABNORMAL LOW (ref 36.0–46.0)
Hemoglobin: 10.9 g/dL — ABNORMAL LOW (ref 12.0–15.0)
Immature Granulocytes: 0 %
Lymphocytes Relative: 42 %
Lymphs Abs: 1.1 10*3/uL (ref 0.7–4.0)
MCH: 31.1 pg (ref 26.0–34.0)
MCHC: 31.9 g/dL (ref 30.0–36.0)
MCV: 97.7 fL (ref 80.0–100.0)
Monocytes Absolute: 0.2 10*3/uL (ref 0.1–1.0)
Monocytes Relative: 8 %
Neutro Abs: 1.3 10*3/uL — ABNORMAL LOW (ref 1.7–7.7)
Neutrophils Relative %: 48 %
Platelets: 248 10*3/uL (ref 150–400)
RBC: 3.5 MIL/uL — ABNORMAL LOW (ref 3.87–5.11)
RDW: 13.4 % (ref 11.5–15.5)
WBC: 2.7 10*3/uL — ABNORMAL LOW (ref 4.0–10.5)
nRBC: 0 % (ref 0.0–0.2)

## 2019-08-01 LAB — SALICYLATE LEVEL: Salicylate Lvl: <7 mg/dL — ABNORMAL LOW (ref 7.0–30.0)

## 2019-08-01 LAB — ETHANOL: Alcohol, Ethyl (B): 183 mg/dL — ABNORMAL HIGH (ref <10)

## 2019-08-01 LAB — I-STAT BETA HCG BLOOD, ED (MC, WL, AP ONLY): I-stat hCG, quantitative: <5 m[IU]/mL (ref <5)

## 2019-08-01 LAB — ACETAMINOPHEN LEVEL: Acetaminophen (Tylenol), Serum: <10 ug/mL — ABNORMAL LOW (ref 10–30)

## 2019-08-01 NOTE — ED Notes (Signed)
Patient transported to CT 
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
## 2019-08-01 NOTE — Discharge Instructions (Signed)
You have been treated for a syncopal episode.   I want you to follow-up with neurologist for further evaluation and management of your syncopal episodes.  I want you to come back to emergency department if you develop shortness of breath, chest pain, uncontrolled nausea, vomiting, diarrhea, severe lightheadedness, another seizure, passing out as these require further evaluations.

## 2019-08-01 NOTE — ED Provider Notes (Signed)
[facility]-EMERGENCY DEPT Provider Note   CSN: [id number] Arrival date & time: 08/01/19  0031     History Chief Complaint  Patient presents with  . Seizures    Devita A R Brinton is a 19 y.o. female.  Patient to ED by EMS who reports they were called due to patient having seizures. EMS reports she appeared to be post-ictal on their arrival. She reportedly became violent/aggressive and was sedated by EMS with 10 mg Versed and 5 mg Haldol. She is sedated, stable, on arrival to ED and unable to provide history.  The history is provided by the EMS personnel. No language interpreter was used.       Past Medical History:  Diagnosis Date  . ADHD (attention deficit hyperactivity disorder)    pt. takes vyvanse  . Allergy    Environmental allergies, does not take meds  . Anxiety   . Asthma    uses albuterol inhaler PRN  . Depression   . Headache(784.0)   . Heart murmur    Does not require prophylaxis  . Seizures (HCC)    reports h/o seizures when she was younger but stopped at age 13yo.    . 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 Attention deficit hyperactivity disorder, hyperactive-impulsive type 08/27/2011  . Polysubstance abuse (HCC) 08/27/2011    History reviewed. No pertinent surgical history.   OB History   No obstetric history on file.     Family History  Problem Relation Age of Onset  . Depression Paternal Grandmother        pt. is vague regarding FMhx   . Drug abuse Cousin        pt. is vague regarding FMHx    Social History   Tobacco Use  . Smoking status: Current Every Day Smoker  . Smokeless tobacco: Never Used  Substance Use Topics  . Alcohol use: Yes    Comment: daily  . Drug use: No    Home Medications Prior to Admission medications   Medication Sig Start  Date End Date Taking? Authorizing Provider  albuterol (PROAIR HFA) 108 (90 Base) MCG/ACT inhaler Inhale 2 puffs into the lungs every 6 (six) hours as needed for wheezing or shortness of breath.    [provider]  albuterol (PROVENTIL HFA;VENTOLIN HFA) 108 (90 Base) MCG/ACT inhaler Inhale 1-2 puffs into the lungs every 6 (six) hours as needed for wheezing or shortness of breath. Patient not taking: Reported on 04/26/2019 11/29/17   Eyvonne Mechanic, PA-C  albuterol (PROVENTIL) (2.5 MG/3ML) 0.083% nebulizer solution Take 3 mLs (2.5 mg total) by nebulization every 4 (four) hours as needed for wheezing or shortness of breath. Patient not taking: Reported on 04/26/2019 02/12/14   Dione Booze, MD  bacitracin ointment Apply 1 application topically 2 (two) times daily. 06/17/19   Muthersbaugh, Dahlia Client, PA-C  cephALEXin (KEFLEX) 500 MG capsule Take 1 capsule (500 mg total) by mouth 4 (four) times daily. Patient not taking: Reported on 04/26/2019 06/21/18   Bethel Born, PA-C  Fluticasone Propionate HFA (FLOVENT HFA IN) Inhale 2 puffs into the lungs 2 (two) times daily as needed (for flares).    [provider]  HYDROcodone-acetaminophen (NORCO/VICODIN) 5-325 MG tablet Take 1-2 tablets by mouth every 6 (six) hours as needed for moderate pain or severe pain. 06/12/19  Charlesetta Shanks, MD  hydrocortisone cream 1 % Apply topically 2 (two) times daily. Apply to affected area 2 times daily Patient not taking: Reported on 06/16/2019 04/26/19   Rayna Sexton, PA-C  ibuprofen (ADVIL) 800 MG tablet Take 1 tablet (800 mg total) by mouth 3 (three) times daily as needed for headache. 06/17/19   Muthersbaugh, Jarrett Soho, PA-C  methocarbamol (ROBAXIN) 500 MG tablet Take 1 tablet (500 mg total) by mouth 2 (two) times daily. 06/17/19   Muthersbaugh, Jarrett Soho, PA-C  potassium chloride SA (K-DUR) 20 MEQ tablet Take 1 tablet (20 mEq total) by mouth 2 (two) times daily for 5 days. Patient not taking: Reported on 04/26/2019  06/13/18 06/18/18  McDonald, Mia A, PA-C    Allergies    Aspirin, Aspirin, Latex, Wheat bran, Wheat bran, Egg [eggs or egg-derived products], Eggs or egg-derived products, Latex, Milk-related compounds, and Milk-related compounds  Review of Systems   Review of Systems  Reason unable to perform ROS: Patient sedated.    Physical Exam Updated Vital Signs BP (!) 108/58   Pulse (!) 101   Temp 98.1 F (36.7 C) (Axillary)   Resp (!) 28   SpO2 99%   Physical Exam Vitals and nursing note reviewed.  Constitutional:      Appearance: She is well-developed.  HENT:     Head: Atraumatic.  Cardiovascular:     Rate and Rhythm: Normal rate and regular rhythm.  Pulmonary:     Effort: Pulmonary effort is normal.     Breath sounds: No wheezing, rhonchi or rales.     Comments: Maintaining airway. Abdominal:     General: There is no distension.     Palpations: Abdomen is soft.  Musculoskeletal:     Cervical back: Normal range of motion.  Skin:    General: Skin is warm and dry.  Neurological:     Mental Status: She is alert.     Comments: Unable to test.     ED Results / Procedures / Treatments   Labs (all labs ordered are listed, but only abnormal results are displayed) Labs Reviewed - No data to display  EKG None  Radiology No results found.  Procedures Procedures (including critical care time)  Medications Ordered in ED Medications - No data to display  ED Course  I have reviewed the triage vital signs and the nursing notes.  Pertinent labs & imaging results that were available during my care of the patient were reviewed by me and considered in my medical decision making (see chart for details).    MDM Rules/Calculators/A&P                     Patient to ED via EMS after reported seizure, violent post-ictal period, sedated by EMS for safety of patient and care team.   Chart reviewed. No medications for seizures on patient's chart. She was brought to ED on 5/19 for  seizure but left AMA. No documented neurology evaluation/studies, or diagnosis of seizure. ?psuedoseizure.   No seizure activity on arrival.   2:45 - Still sedated, VSS. NAD.   5:30 - still sedated, VSS, NAD.  6:30 - patient is waking up. She is calm, cooperative. Admits to alcohol use but does remember events leading to EMS being called. Labs are unremarkable except for alcohol of 183. VSS throughout the night.   Plan: observe until she can ambulate safely, and can be discharged at that time.   Final Clinical Impression(s) / ED Diagnoses Final diagnoses:  None  1. Alcohol intoxication  Rx / DC Orders ED Discharge Orders    None       Danne Harbor 08/01/19 1751    Dione Booze, MD 08/01/19 [phone]

## 2019-08-01 NOTE — ED Notes (Signed)
Pt's mother called back for an update. Pt's mother was informed that her daughter was stable and had not had any more seizures. Pt's mother requested a drug screen for her daughter. I relayed the information to the PA, and when I went to take the mother off hold she did not answer.

## 2019-08-01 NOTE — ED Notes (Signed)
Patient's mother Cristie Hem called, update with permission from patient.  Mother is upset because she wanted a MD or PA to call her last night and no one did.  Chrissie Noa, [place] made aware and will call to speak with patient mother.

## 2019-08-07 DIAGNOSIS — S92302D Fracture of unspecified metatarsal bone(s), left foot, subsequent encounter for fracture with routine healing: Secondary | ICD-10-CM | POA: Diagnosis not present

## 2020-07-22 ENCOUNTER — Other Ambulatory Visit: Payer: Self-pay

## 2020-07-22 ENCOUNTER — Ambulatory Visit (HOSPITAL_COMMUNITY)
Admission: EM | Admit: 2020-07-22 | Discharge: 2020-07-22 | Disposition: A | Discharge status: Home / Self Care | Payer: Medicaid Other

## 2020-07-22 NOTE — ED Notes (Signed)
Spoke with pt on phone, states left UC. Advised pt to try an appt tomorrow or walkin.

## 2020-07-22 NOTE — ED Triage Notes (Signed)
Called pt 3 x in lobby no answer.  

## 2020-07-23 ENCOUNTER — Ambulatory Visit
Admission: EM | Admit: 2020-07-23 | Discharge: 2020-07-23 | Disposition: A | Discharge status: Home / Self Care | Payer: Medicaid Other | Attending: Emergency Medicine | Admitting: Emergency Medicine

## 2020-07-23 ENCOUNTER — Ambulatory Visit (INDEPENDENT_AMBULATORY_CARE_PROVIDER_SITE_OTHER): Payer: Medicaid Other

## 2020-07-23 ENCOUNTER — Other Ambulatory Visit: Payer: Self-pay

## 2020-07-23 DIAGNOSIS — J4521 Mild intermittent asthma with (acute) exacerbation: Secondary | ICD-10-CM

## 2020-07-23 DIAGNOSIS — J45909 Unspecified asthma, uncomplicated: Secondary | ICD-10-CM | POA: Diagnosis not present

## 2020-07-23 DIAGNOSIS — R062 Wheezing: Secondary | ICD-10-CM | POA: Diagnosis not present

## 2020-07-23 DIAGNOSIS — R059 Cough, unspecified: Secondary | ICD-10-CM | POA: Diagnosis not present

## 2020-07-23 DIAGNOSIS — R051 Acute cough: Secondary | ICD-10-CM | POA: Diagnosis not present

## 2020-07-23 DIAGNOSIS — Z20822 Contact with and (suspected) exposure to covid-19: Secondary | ICD-10-CM | POA: Diagnosis not present

## 2020-07-23 MED ORDER — NAPROXEN 375 MG PO TABS
375.0000 mg | ORAL_TABLET | Freq: Two times a day (BID) | ORAL | 0 refills | Status: DC
Start: 2020-07-23 — End: 2021-10-14

## 2020-07-23 MED ORDER — IPRATROPIUM-ALBUTEROL 0.5-2.5 (3) MG/3ML IN SOLN: 3.0000 mL | Freq: Four times a day (QID) | RESPIRATORY_TRACT | 0 refills | Status: AC | PRN

## 2020-07-23 MED ORDER — BENZONATATE 200 MG PO CAPS: 200.0000 mg | ORAL_CAPSULE | Freq: Three times a day (TID) | ORAL | 0 refills | Status: AC | PRN

## 2020-07-23 MED ORDER — METHYLPREDNISOLONE SODIUM SUCC 125 MG IJ SOLR
125.0000 mg | Freq: Once | INTRAMUSCULAR | Status: AC
  Administered 2020-07-23: 125 mg via INTRAMUSCULAR

## 2020-07-23 MED ORDER — PREDNISONE 50 MG PO TABS: 50.0000 mg | ORAL_TABLET | Freq: Every day | ORAL | 0 refills | Status: AC

## 2020-07-23 NOTE — Discharge Instructions (Addendum)
We gave you a shot of Solu-Medrol, continue with prednisone daily x5 days Albuterol inhaler or nebulizers as needed for shortness of breath chest tightness and wheezing, please monitor your heart rate while using these Tessalon every 8 hours for cough Rest and fluids Please go to emergency room if breathing not improving or worsening despite use with above

## 2020-07-23 NOTE — ED Triage Notes (Signed)
Patient presents to Urgent Care with complaints of SOB, body aches, headache x 2 days. Pt states she had asthma attack two days ago but left before being seen at the [facility]. Pt states needs refill on her albuterol inhaler.

## 2020-07-23 NOTE — ED Provider Notes (Signed)
[facility]    CSN: [id number] Arrival date & time: 07/23/20  1243      History   Chief Complaint Chief Complaint  Patient presents with  . Asthma    SOB     HPI Ashley Carlson is a 20 y.o. female history of asthma presenting today for asthma exacerbation and body aches.  Reports over the past 2 days she has had shortness of breath chest tightness and wheezing.  Reports associated body aches and headaches with cough.  Had mild congestion as well.  Denies any known fevers.  Out of albuterol inhaler.  HPI  Past Medical History:  Diagnosis Date  . ADHD (attention deficit hyperactivity disorder)    pt. takes vyvanse  . Allergy    Environmental allergies, does not take meds  . Anxiety   . Asthma    uses albuterol inhaler PRN  . Depression   . Headache(784.0)   . Heart murmur    Does not require prophylaxis  . Seizures (HCC)    reports h/o seizures when she was younger but stopped at age 56yo.    . 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 Attention deficit hyperactivity disorder, hyperactive-impulsive type 08/27/2011  . Polysubstance abuse (HCC) 08/27/2011    History reviewed. No pertinent surgical history.  OB History   No obstetric history on file.      Home Medications    Prior to Admission medications   Medication Sig Start Date End Date Taking? Authorizing Provider  benzonatate (TESSALON) 200 MG capsule Take 1 capsule (200 mg total) by mouth 3 (three) times daily as needed for up to 7 days for cough. 07/23/20 07/30/20 Yes Seynabou Fults C, PA-C  ipratropium-albuterol (DUONEB) 0.5-2.5 (3) MG/3ML SOLN Take 3 mLs by nebulization every 6 (six) hours as needed. 07/23/20  Yes Kaelum Kissick C, PA-C  naproxen (NAPROSYN) 375 MG tablet Take 1 tablet (375 mg total) by mouth 2 (two) times daily.  07/23/20  Yes Araceli Coufal C, PA-C  predniSONE (DELTASONE) 50 MG tablet Take 1 tablet (50 mg total) by mouth daily with breakfast for 5 days. 07/23/20 07/28/20 Yes Reanne Nellums C, PA-C  albuterol (PROAIR HFA) 108 (90 Base) MCG/ACT inhaler Inhale 2 puffs into the lungs every 6 (six) hours as needed for wheezing or shortness of breath.    [provider]  bacitracin ointment Apply 1 application topically 2 (two) times daily. 06/17/19   Muthersbaugh, Dahlia Client, PA-C  Fluticasone Propionate HFA (FLOVENT HFA IN) Inhale 2 puffs into the lungs 2 (two) times daily as needed (for flares).    [provider]  methocarbamol (ROBAXIN) 500 MG tablet Take 1 tablet (500 mg total) by mouth 2 (two) times daily. 06/17/19   Muthersbaugh, Dahlia Client, PA-C  potassium chloride SA (K-DUR) 20 MEQ tablet Take 1 tablet (20 mEq total) by mouth 2 (two) times daily for 5 days. Patient not taking: Reported on 04/26/2019 06/13/18 07/23/20  Barkley Boards, PA-C    Family History Family History  Problem Relation Age of Onset  . Depression Paternal Grandmother        pt. is vague regarding FMhx   . Drug abuse Cousin        pt. is vague regarding FMHx    Social History Social History   Tobacco Use  . Smoking status:  Current Every Day Smoker  . Smokeless tobacco: Never Used  Substance Use Topics  . Alcohol use: Yes    Comment: daily  . Drug use: No     Allergies   Aspirin, Aspirin, Latex, Wheat bran, Wheat bran, Egg [eggs or egg-derived products], Eggs or egg-derived products, Latex, Milk-related compounds, and Milk-related compounds   Review of Systems Review of Systems  Constitutional: Negative for activity change, appetite change, chills, fatigue and fever.  HENT: Positive for congestion and rhinorrhea. Negative for ear pain, sinus pressure, sore throat and trouble swallowing.   Eyes: Negative for discharge and redness.  Respiratory: Positive for cough, chest tightness, shortness of breath and wheezing.    Cardiovascular: Negative for chest pain.  Gastrointestinal: Negative for abdominal pain, diarrhea, nausea and vomiting.  Musculoskeletal: Negative for myalgias.  Skin: Negative for rash.  Neurological: Negative for dizziness, light-headedness and headaches.     Physical Exam Triage Vital Signs ED Triage Vitals  Enc Vitals Group     BP 07/23/20 1318 124/84     Pulse Rate 07/23/20 1318 (!) 126     Resp --      Temp 07/23/20 1320 98.6 F (37 C)     Temp Source 07/23/20 1320 Oral     SpO2 07/23/20 1318 (!) 89 %     Weight --      Height --      Head Circumference --      Peak Flow --      Pain Score 07/23/20 1314 8     Pain Loc --      Pain Edu? --      Excl. in GC? --    No data found.  Updated Vital Signs BP 124/84 (BP Location: Right Arm)   Pulse (!) 126   Temp 98.6 F (37 C) (Oral)   LMP 07/23/2020   SpO2 93%   Visual Acuity Right Eye Distance:   Left Eye Distance:   Bilateral Distance:    Right Eye Near:   Left Eye Near:    Bilateral Near:     Physical Exam Vitals and nursing note reviewed.  Constitutional:      Appearance: She is well-developed.     Comments: No acute distress  HENT:     Head: Normocephalic and atraumatic.     Ears:     Comments: Bilateral ears without tenderness to palpation of external auricle, tragus and mastoid, EAC's without erythema or swelling, TM's with good bony landmarks and cone of light. Non erythematous.     Nose: Nose normal.     Mouth/Throat:     Comments: Oral mucosa pink and moist, no tonsillar enlargement or exudate. Posterior pharynx patent and nonerythematous, no uvula deviation or swelling. Normal phonation. Eyes:     Conjunctiva/sclera: Conjunctivae normal.  Cardiovascular:     Rate and Rhythm: Tachycardia present.  Pulmonary:     Effort: Pulmonary effort is normal. No respiratory distress.     Comments: Inspiratory and expiratory wheezing and rhonchi noted throughout bilateral lung fields, slight  improvement after albuterol and Solu-Medrol Abdominal:     General: There is no distension.  Musculoskeletal:        General: Normal range of motion.     Cervical back: Neck supple.  Skin:    General: Skin is warm and dry.  Neurological:     Mental Status: She is alert and oriented to person, place, and time.      UC Treatments / Results  Labs (  all labs ordered are listed, but only abnormal results are displayed) Labs Reviewed  NOVEL CORONAVIRUS, NAA    EKG   Radiology DG Chest 2 View  Result Date: 07/23/2020 CLINICAL DATA:  Asthma.  Wheezing and cough EXAM: CHEST - 2 VIEW COMPARISON:  06/12/2019 FINDINGS: The heart size and mediastinal contours are within normal limits. Both lungs are clear. The visualized skeletal structures are unremarkable. IMPRESSION: No active cardiopulmonary disease. Electronically Signed   By: Marlan Palau M.D.   On: 07/23/2020 14:17    Procedures Procedures (including critical care time)  Medications Ordered in UC Medications  methylPREDNISolone sodium succinate (SOLU-MEDROL) 125 mg/2 mL injection 125 mg (125 mg Intramuscular Given 07/23/20 1357)    Initial Impression / Assessment and Plan / UC Course  I have reviewed the triage vital signs and the nursing notes.  Pertinent labs & imaging results that were available during my care of the patient were reviewed by me and considered in my medical decision making (see chart for details).     Chest x-ray unremarkable, treating for viral URI with associated asthma exacerbation-Solu-Medrol prior provided prior to discharge, continue on prednisone x5 days, albuterol inhaler as needed, provided breathing machine along with nebulizers to use as needed at home, continue symptomatic and supportive care of cough and congestion rest and fluids.  Push fluids.  O2 increased to 94/95% prior to discharge  Discussed strict return precautions. Patient verbalized understanding and is agreeable with plan.  Final  Clinical Impressions(s) / UC Diagnoses   Final diagnoses:  Mild intermittent asthma with acute exacerbation     Discharge Instructions     We gave you a shot of Solu-Medrol, continue with prednisone daily x5 days Albuterol inhaler or nebulizers as needed for shortness of breath chest tightness and wheezing, please monitor your heart rate while using these Tessalon every 8 hours for cough Rest and fluids Please go to emergency room if breathing not improving or worsening despite use with above    ED Prescriptions    Medication Sig Dispense Auth. Provider   predniSONE (DELTASONE) 50 MG tablet Take 1 tablet (50 mg total) by mouth daily with breakfast for 5 days. 5 tablet Maryagnes Carrasco C, PA-C   ipratropium-albuterol (DUONEB) 0.5-2.5 (3) MG/3ML SOLN Take 3 mLs by nebulization every 6 (six) hours as needed. 360 mL Mendel Binsfeld C, PA-C   benzonatate (TESSALON) 200 MG capsule Take 1 capsule (200 mg total) by mouth 3 (three) times daily as needed for up to 7 days for cough. 28 capsule Charletha Dalpe C, PA-C   naproxen (NAPROSYN) 375 MG tablet Take 1 tablet (375 mg total) by mouth 2 (two) times daily. 20 tablet Alfonso Carden, [place] C, PA-C     PDMP not reviewed this encounter.   Lew Dawes, [place] 07/23/20 1446

## 2020-07-24 LAB — SARS-COV-2, NAA 2 DAY TAT

## 2020-07-24 LAB — NOVEL CORONAVIRUS, NAA: SARS-CoV-2, NAA: NOT DETECTED

## 2020-11-12 ENCOUNTER — Ambulatory Visit
Admission: EM | Admit: 2020-11-12 | Discharge: 2020-11-12 | Disposition: A | Discharge status: Home / Self Care | Payer: Medicaid Other | Attending: Urgent Care | Admitting: Urgent Care

## 2020-11-12 ENCOUNTER — Other Ambulatory Visit: Payer: Self-pay

## 2020-11-12 ENCOUNTER — Encounter: Payer: Self-pay | Admitting: Emergency Medicine

## 2020-11-12 DIAGNOSIS — R252 Cramp and spasm: Secondary | ICD-10-CM | POA: Diagnosis not present

## 2020-11-12 DIAGNOSIS — Z202 Contact with and (suspected) exposure to infections with a predominantly sexual mode of transmission: Secondary | ICD-10-CM | POA: Diagnosis not present

## 2020-11-12 MED ORDER — METRONIDAZOLE 500 MG PO TABS
500.0000 mg | ORAL_TABLET | Freq: Two times a day (BID) | ORAL | 0 refills | Status: DC
Start: 2020-11-12 — End: 2021-10-14

## 2020-11-12 MED ORDER — TIZANIDINE HCL 4 MG PO TABS
4.0000 mg | ORAL_TABLET | Freq: Every day | ORAL | 0 refills | Status: DC
Start: 2020-11-12 — End: 2021-10-14

## 2020-11-12 NOTE — Discharge Instructions (Signed)
Avoid all forms of sexual intercourse (oral, vaginal, anal) for the next 7 days to avoid spreading/reinfecting or at least until we can see what kinds of infection results are positive. Return if symptoms worsen/do not resolve, you develop fever, abdominal pain, blood in your urine, or are re-exposed to a sexually transmitted infection (STI).  

## 2020-11-12 NOTE — ED Provider Notes (Signed)
[facility]   MRN: [id number] DOB: September 06, 2000  Subjective:   Ashley Carlson is a 20 y.o. female presenting for treatment for trichomoniasis.  Patient is sexually active with 1 female only.  Reports that both of them are asymptomatic.  But she found out she had trichomoniasis this past week. Denies fever, n/v, abdominal pain, pelvic pain, rashes, dysuria, urinary frequency, hematuria, vaginal discharge.  Patient has also had persistent history of cramping in her legs.  Reports that she has gone to the hospital and had complete work-up, states they did not find anything and was prescribed naproxen.  Reports that this helped some but did not make much of a difference.  Symptoms primarily occur at night.  Patient does have history of seizures, reports that she has not had a seizure in years.  She does drink heavily about once a week.  She tries to hydrate with water and Gatorade.  States that when she drinks Gatorade for hangovers it makes her sick to her stomach.  Does not have a primary care provider.  No current facility-administered medications for this encounter.  Current Outpatient Medications:    albuterol (PROAIR HFA) 108 (90 Base) MCG/ACT inhaler, Inhale 2 puffs into the lungs every 6 (six) hours as needed for wheezing or shortness of breath., Disp: , Rfl:    bacitracin ointment, Apply 1 application topically 2 (two) times daily., Disp: 120 g, Rfl: 0   Fluticasone Propionate HFA (FLOVENT HFA IN), Inhale 2 puffs into the lungs 2 (two) times daily as needed (for flares)., Disp: , Rfl:    ipratropium-albuterol (DUONEB) 0.5-2.5 (3) MG/3ML SOLN, Take 3 mLs by nebulization every 6 (six) hours as needed., Disp: 360 mL, Rfl: 0   methocarbamol (ROBAXIN) 500 MG tablet, Take 1 tablet (500 mg total) by mouth 2 (two) times daily., Disp: 20 tablet, Rfl: 0   naproxen (NAPROSYN) 375 MG tablet, Take 1 tablet (375 mg total) by mouth 2 (two) times daily., Disp: 20 tablet, Rfl: 0   Allergies   Allergen Reactions   Aspirin Shortness Of Breath   Aspirin Shortness Of Breath   Latex Hives and Shortness Of Breath   Wheat Bran Shortness Of Breath   Wheat Bran Shortness Of Breath   Egg [Eggs Or Egg-Derived Products] Nausea And Vomiting   Eggs Or Egg-Derived Products Nausea And Vomiting   Latex Other (See Comments)    Breaks out   Milk-Related Compounds Nausea And Vomiting   Milk-Related Compounds Nausea And Vomiting    Past Medical History:  Diagnosis Date   ADHD (attention deficit hyperactivity disorder)    pt. takes vyvanse   Allergy    Environmental allergies, does not take meds   Anxiety    Asthma    uses albuterol inhaler PRN   Depression    Headache(784.0)    Heart murmur    Does not require prophylaxis   Seizures (HCC)    reports h/o seizures when she was younger but stopped at age 31yo.     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 No pertinent surgical history.   Family History  Problem Relation Age of Onset   Depression Paternal Grandmother        pt. is vague regarding FMhx    Drug abuse Cousin        pt. is vague regarding FMHx    Social History   Tobacco Use   Smoking status: Every Day  Smokeless tobacco: Never  Substance Use Topics   Alcohol use: Yes    Comment: daily   Drug use: No    ROS   Objective:   Vitals: BP 123/74 (BP Location: Left Arm)   Pulse (!) 114   Temp 98.4 F (36.9 C) (Oral)   Resp 18   SpO2 95%   Pulse recheck was 82bpm.   Physical Exam Constitutional:      General: She is not in acute distress.    Appearance: Normal appearance. She is well-developed. She is not ill-appearing, toxic-appearing or diaphoretic.  HENT:     Head: Normocephalic and atraumatic.     Nose: Nose normal.     Mouth/Throat:     Mouth: Mucous membranes are moist.     Pharynx: Oropharynx is clear.  Eyes:     General: No scleral icterus.       Right eye: No discharge.        Left eye: No  discharge.     Extraocular Movements: Extraocular movements intact.     Conjunctiva/sclera: Conjunctivae normal.     Pupils: Pupils are equal, round, and reactive to light.  Cardiovascular:     Rate and Rhythm: Normal rate.  Pulmonary:     Effort: Pulmonary effort is normal.  Musculoskeletal:        General: No swelling, tenderness or deformity. Normal range of motion.  Skin:    General: Skin is warm and dry.  Neurological:     General: No focal deficit present.     Mental Status: She is alert and oriented to person, place, and time.     Motor: No weakness.     Coordination: Coordination normal.     Gait: Gait normal.     Deep Tendon Reflexes: Reflexes normal.  Psychiatric:        Mood and Affect: Mood normal.        Behavior: Behavior normal.        Thought Content: Thought content normal.        Judgment: Judgment normal.    Assessment and Plan :   PDMP not reviewed this encounter.  1. Trichomonas exposure   2. Leg cramping     Will treat empirically for trichomonas given her exposure. Labs pending will otherwise treat as appropriate. Patient has had chronic intermittent leg cramping. No acute findings on exam. Has had work up in the past which was negative. Recommended avoidance of alcohol, Gatorade and to hydrate much more consistently with plain water. Recommended she establish care with a new PCP for further work up and follow up. Counseled patient on potential for adverse effects with medications prescribed/recommended today, ER and return-to-clinic precautions discussed, patient verbalized understanding.    Wallis Bamberg, PA-C 11/12/20 1009

## 2020-11-12 NOTE — ED Triage Notes (Signed)
Her partner told her yesterday she had trichomoniasis yesterday, so she wants to be check. Denies symptoms. Also c/o leg cramping at night

## 2020-11-13 LAB — CERVICOVAGINAL ANCILLARY ONLY
Bacterial Vaginitis (gardnerella): POSITIVE — AB
Candida Glabrata: NEGATIVE
Candida Vaginitis: NEGATIVE
Chlamydia: NEGATIVE
Comment: NEGATIVE
Comment: NEGATIVE
Comment: NEGATIVE
Comment: NEGATIVE
Comment: NEGATIVE
Comment: NORMAL
Neisseria Gonorrhea: NEGATIVE
Trichomonas: NEGATIVE

## 2021-01-22 DIAGNOSIS — Z419 Encounter for procedure for purposes other than remedying health state, unspecified: Secondary | ICD-10-CM | POA: Diagnosis not present

## 2021-02-22 DIAGNOSIS — Z419 Encounter for procedure for purposes other than remedying health state, unspecified: Secondary | ICD-10-CM | POA: Diagnosis not present

## 2021-03-25 DIAGNOSIS — Z419 Encounter for procedure for purposes other than remedying health state, unspecified: Secondary | ICD-10-CM | POA: Diagnosis not present

## 2021-04-17 ENCOUNTER — Other Ambulatory Visit: Payer: Self-pay

## 2021-04-17 ENCOUNTER — Emergency Department (HOSPITAL_COMMUNITY)
Admission: EM | Admit: 2021-04-17 | Discharge: 2021-04-17 | Discharge status: Against Medical Advice | Payer: Medicaid Other | Source: Home / Self Care

## 2021-04-17 ENCOUNTER — Ambulatory Visit (INDEPENDENT_AMBULATORY_CARE_PROVIDER_SITE_OTHER): Payer: Medicaid Other

## 2021-04-17 ENCOUNTER — Encounter (HOSPITAL_COMMUNITY): Payer: Self-pay | Admitting: Emergency Medicine

## 2021-04-17 ENCOUNTER — Ambulatory Visit (HOSPITAL_COMMUNITY)
Admission: EM | Admit: 2021-04-17 | Discharge: 2021-04-17 | Disposition: A | Discharge status: Home / Self Care | Payer: Medicaid Other | Attending: Internal Medicine | Admitting: Internal Medicine

## 2021-04-17 DIAGNOSIS — J45901 Unspecified asthma with (acute) exacerbation: Secondary | ICD-10-CM

## 2021-04-17 DIAGNOSIS — R062 Wheezing: Secondary | ICD-10-CM | POA: Diagnosis not present

## 2021-04-17 DIAGNOSIS — R0602 Shortness of breath: Secondary | ICD-10-CM | POA: Diagnosis not present

## 2021-04-17 MED ORDER — METHYLPREDNISOLONE SODIUM SUCC 125 MG IJ SOLR
INTRAMUSCULAR | Status: AC
  Filled 2021-04-17: qty 2

## 2021-04-17 MED ORDER — METHYLPREDNISOLONE SODIUM SUCC 125 MG IJ SOLR
125.0000 mg | Freq: Once | INTRAMUSCULAR | Status: AC
  Administered 2021-04-17: 125 mg via INTRAMUSCULAR

## 2021-04-17 MED ORDER — PREDNISONE 50 MG PO TABS: ORAL_TABLET | ORAL | 0 refills | Status: DC

## 2021-04-17 MED ORDER — ALBUTEROL SULFATE HFA 108 (90 BASE) MCG/ACT IN AERS: 2.0000 | INHALATION_SPRAY | Freq: Four times a day (QID) | RESPIRATORY_TRACT | 0 refills | Status: AC | PRN

## 2021-04-17 MED ORDER — IPRATROPIUM-ALBUTEROL 0.5-2.5 (3) MG/3ML IN SOLN
3.0000 mL | Freq: Once | RESPIRATORY_TRACT | Status: AC
  Administered 2021-04-17: 3 mL via RESPIRATORY_TRACT

## 2021-04-17 MED ORDER — ALBUTEROL SULFATE (2.5 MG/3ML) 0.083% IN NEBU: 2.5000 mg | INHALATION_SOLUTION | RESPIRATORY_TRACT | 2 refills | Status: AC | PRN

## 2021-04-17 MED ORDER — IPRATROPIUM-ALBUTEROL 0.5-2.5 (3) MG/3ML IN SOLN
RESPIRATORY_TRACT | Status: AC
  Filled 2021-04-17: qty 3

## 2021-04-17 NOTE — Discharge Instructions (Addendum)
Your chest x-ray today does not show any evidence of pneumonia.  I recommend you stop smoking.  Start prescribed prednisone tomorrow as you received a shot at urgent care today.  Use albuterol inhaler every 4 hours as needed for shortness of breath.  I am also sending you home with a nebulizer machine to use as needed.  Please monitor your heart rate while using albuterol.  Should you continue to have shortness of breath or your symptoms should worsen, you will need to go directly to the ER as we discussed.

## 2021-04-17 NOTE — ED Triage Notes (Signed)
Pt reports a cough x 4 days. Pt states when coughing it causes her asthma to flare up and no longer has an inhaler. Requesting a new inhaler and cough medicine.

## 2021-04-17 NOTE — ED Provider Notes (Signed)
[facility]    CSN: [id number] Arrival date & time: 04/17/21  1525      History   Chief Complaint Chief Complaint  Patient presents with   Cough    HPI Ashley Carlson is a 21 y.o. female with history of asthma presents to urgent care today with 4-day history of cough and shortness of breath.  Patient states symptoms progressively worse over the last few days.  Uncertain if she feels sick or not but perhaps some nasal congestion.  States cough mostly nonproductive, no recent fever or chills, ST, ear pain, CP, abd pain, n/v/d.  Patient admits to continued tobacco abuse smoking just prior to arrival.   Past Medical History:  Diagnosis Date   ADHD (attention deficit hyperactivity disorder)    pt. takes vyvanse   Allergy    Environmental allergies, does not take meds   Anxiety    Asthma    uses albuterol inhaler PRN   Depression    Headache(784.0)    Heart murmur    Does not require prophylaxis   Seizures (HCC)    reports h/o seizures when she was younger but stopped at age 70yo.     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 No pertinent surgical history.  OB History   No obstetric history on file.      Home Medications    Prior to Admission medications   Medication Sig Start Date End Date Taking? Authorizing Provider  albuterol (PROVENTIL) (2.5 MG/3ML) 0.083% nebulizer solution Take 3 mLs (2.5 mg total) by nebulization every 4 (four) hours as needed for wheezing or shortness of breath. 04/17/21 04/17/22 Yes Rolla Etienne, NP  albuterol (VENTOLIN HFA) 108 (90 Base) MCG/ACT inhaler Inhale 2 puffs into the lungs every 6 (six) hours as needed for wheezing or  shortness of breath. 04/17/21  Yes Rolla Etienne, NP  predniSONE (DELTASONE) 50 MG tablet Start 2/25 04/17/21  Yes Rolla Etienne, NP  bacitracin ointment Apply 1 application topically 2 (two) times daily. 06/17/19   Muthersbaugh, Dahlia Client, PA-C  Fluticasone Propionate HFA (FLOVENT HFA IN) Inhale 2 puffs into the lungs 2 (two) times daily as needed (for flares).    [provider]  ipratropium-albuterol (DUONEB) 0.5-2.5 (3) MG/3ML SOLN Take 3 mLs by nebulization every 6 (six) hours as needed. 07/23/20   Wieters, Hallie C, PA-C  methocarbamol (ROBAXIN) 500 MG tablet Take 1 tablet (500 mg total) by mouth 2 (two) times daily. 06/17/19   Muthersbaugh, Dahlia Client, PA-C  metroNIDAZOLE (FLAGYL) 500 MG tablet Take 1 tablet (500 mg total) by mouth 2 (two) times daily with a meal. DO NOT CONSUME ALCOHOL WHILE TAKING THIS MEDICATION. 11/12/20   Wallis Bamberg, PA-C  naproxen (NAPROSYN) 375 MG tablet Take 1 tablet (375 mg total) by mouth 2 (two) times daily. 07/23/20   Wieters, Hallie C, PA-C  tiZANidine (ZANAFLEX) 4 MG tablet Take 1 tablet (4 mg total) by mouth at bedtime. 11/12/20   Wallis Bamberg, PA-C  potassium chloride SA (K-DUR) 20 MEQ tablet Take 1 tablet (20 mEq total) by mouth 2 (two) times daily for 5 days. Patient not taking: Reported on 04/26/2019 06/13/18 07/23/20  Barkley Boards, PA-C    Family  History Family History  Problem Relation Age of Onset   Deep vein thrombosis Mother    Healthy Father    Depression Paternal Grandmother        pt. is vague regarding FMhx    Drug abuse Cousin        pt. is vague regarding FMHx    Social History Social History   Tobacco Use   Smoking status: Every Day   Smokeless tobacco: Never  Substance Use Topics   Alcohol use: Yes    Comment: daily   Drug use: No     Allergies   Aspirin, Aspirin, Latex, Wheat bran, Wheat bran, Egg [eggs or egg-derived products], Eggs or egg-derived products, Latex, Milk-related compounds, and Milk-related compounds   Review of  Systems As stated in HPI otherwise negative   Physical Exam Triage Vital Signs ED Triage Vitals  Enc Vitals Group     BP 04/17/21 1646 138/83     Pulse Rate 04/17/21 1646 (!) 112     Resp 04/17/21 1646 18     Temp 04/17/21 1646 97.7 F (36.5 C)     Temp Source 04/17/21 1646 Oral     SpO2 04/17/21 1646 97 %     Weight 04/17/21 1644 134 lb 14.7 oz (61.2 kg)     Height 04/17/21 1644 5\' 5"  (1.651 m)     Head Circumference --      Peak Flow --      Pain Score 04/17/21 1644 10     Pain Loc --      Pain Edu? --      Excl. in GC? --    No data found.  Updated Vital Signs BP 138/83 (BP Location: Left Arm)    Pulse (!) 107    Temp 97.7 F (36.5 C) (Oral)    Resp 18    Ht 5\' 5"  (1.651 m)    Wt 61.2 kg    SpO2 94% Comment: post neb treatment.   BMI 22.45 kg/m   Visual Acuity Right Eye Distance:   Left Eye Distance:   Bilateral Distance:    Right Eye Near:   Left Eye Near:    Bilateral Near:     Physical Exam Constitutional:      General: She is not in acute distress.    Appearance: Normal appearance. She is not ill-appearing, toxic-appearing or diaphoretic.     Comments: Seemingly more comfortable after steroid injection and nebulizer treatment  HENT:     Right Ear: Tympanic membrane normal.     Left Ear: Tympanic membrane normal.     Nose: No congestion or rhinorrhea.     Mouth/Throat:     Mouth: Mucous membranes are moist.     Pharynx: Oropharynx is clear. No oropharyngeal exudate or posterior oropharyngeal erythema.  Eyes:     Extraocular Movements: Extraocular movements intact.     Conjunctiva/sclera: Conjunctivae normal.  Cardiovascular:     Rate and Rhythm: Normal rate and regular rhythm.     Heart sounds: No murmur heard.   No friction rub. No gallop.  Pulmonary:     Effort: Pulmonary effort is normal.     Breath sounds: Wheezing present.     Comments: Diffuse wheezing throughout improved air movement after nebulizer and IM steroid though still with diffuse  expiratory wheeze. Musculoskeletal:     Cervical back: Normal range of motion and neck supple.  Skin:    General: Skin is warm and dry.  Neurological:  General: No focal deficit present.     Mental Status: She is alert and oriented to person, place, and time.  Psychiatric:        Mood and Affect: Mood normal.        Behavior: Behavior normal.     UC Treatments / Results  Labs (all labs ordered are listed, but only abnormal results are displayed) Labs Reviewed - No data to display  EKG   Radiology DG Chest 2 View  Result Date: 04/17/2021 CLINICAL DATA:  Shortness of breath and wheezing in a 21 year old female. EXAM: CHEST - 2 VIEW COMPARISON:  July 23, 2020. FINDINGS: Trachea midline. Cardiomediastinal contours and hilar structures are normal. Lungs are clear. No signs of pleural effusion. No acute skeletal process on limited assessment. IMPRESSION: No active cardiopulmonary disease. Electronically Signed   By: Donzetta Kohut M.D.   On: 04/17/2021 17:18    Procedures Procedures (including critical care time)  Medications Ordered in UC Medications  ipratropium-albuterol (DUONEB) 0.5-2.5 (3) MG/3ML nebulizer solution 3 mL (3 mLs Nebulization Given 04/17/21 1720)  methylPREDNISolone sodium succinate (SOLU-MEDROL) 125 mg/2 mL injection 125 mg (125 mg Intramuscular Given 04/17/21 1720)    Initial Impression / Assessment and Plan / UC Course  I have reviewed the triage vital signs and the nursing notes.  Pertinent labs & imaging results that were available during my care of the patient were reviewed by me and considered in my medical decision making (see chart for details).  Acute asthma exacerbation -likely exacerbated by URI and continued tobacco abuse. No evidence of bacterial etiology. CXR unremarkable -Pt declined Covid testing -Improved with nebulizer and IM steroid in clinic -Will d/c home c nebulizer as pt states she has misplaced her previous machine. Instructed to  monitor HR with albuterol use -steroid burst, MDI -Strict instructions to go to ED for any worsening or persistent symptoms.   Final Clinical Impressions(s) / UC Diagnoses   Final diagnoses:  Moderate asthma with exacerbation, unspecified whether persistent     Discharge Instructions      Your chest x-ray today does not show any evidence of pneumonia.  I recommend you stop smoking.  Start prescribed prednisone tomorrow as you received a shot at urgent care today.  Use albuterol inhaler every 4 hours as needed for shortness of breath.  I am also sending you home with a nebulizer machine to use as needed.  Please monitor your heart rate while using albuterol.  Should you continue to have shortness of breath or your symptoms should worsen, you will need to go directly to the ER as we discussed.     ED Prescriptions     Medication Sig Dispense Auth. Provider   predniSONE (DELTASONE) 50 MG tablet Start 2/25 5 tablet Rolla Etienne, NP   albuterol (PROVENTIL) (2.5 MG/3ML) 0.083% nebulizer solution Take 3 mLs (2.5 mg total) by nebulization every 4 (four) hours as needed for wheezing or shortness of breath. 75 mL Rolla Etienne, NP   albuterol (VENTOLIN HFA) 108 (90 Base) MCG/ACT inhaler Inhale 2 puffs into the lungs every 6 (six) hours as needed for wheezing or shortness of breath. 8 g Rolla Etienne, NP      PDMP not reviewed this encounter.   Rolla Etienne, NP 04/21/21 [phone]

## 2021-04-17 NOTE — ED Notes (Signed)
Pt refused covid swab. °

## 2021-04-20 ENCOUNTER — Telehealth: Payer: Self-pay

## 2021-04-20 NOTE — Telephone Encounter (Signed)
Transition Care Management Unsuccessful Follow-up Telephone Call ° °Date of discharge and from where:  04/17/2021 from [place] ° °Attempts:  1st Attempt ° °Reason for unsuccessful TCM follow-up call:  Left voice message ° ° ° °

## 2021-04-21 ENCOUNTER — Telehealth (HOSPITAL_COMMUNITY): Payer: Self-pay | Admitting: Emergency Medicine

## 2021-04-21 NOTE — Telephone Encounter (Signed)
Transition Care Management Unsuccessful Follow-up Telephone Call  Date of discharge and from where:  04/17/2021-[place]   Attempts:  2nd Attempt  Reason for unsuccessful TCM follow-up call:  Left voice message

## 2021-04-21 NOTE — Telephone Encounter (Signed)
Received notification from pharmacy that patient's albuterol nebulizer soltuion is on backorder.  Reviewed with Mickel Baas, APP and she states okay to send to different pharmacy if patient wants.   Attempted to reach patient x 1, unable to LVM

## 2021-04-22 DIAGNOSIS — Z419 Encounter for procedure for purposes other than remedying health state, unspecified: Secondary | ICD-10-CM | POA: Diagnosis not present

## 2021-04-22 NOTE — Telephone Encounter (Signed)
Transition Care Management Unsuccessful Follow-up Telephone Call ? ?Date of discharge and from where:  04/17/2021 from [place] ? ?Attempts:  3rd Attempt ? ?Reason for unsuccessful TCM follow-up call:  Unable to reach patient ? ? ? ?

## 2021-10-14 ENCOUNTER — Ambulatory Visit: Admission: EM | Admit: 2021-10-14 | Discharge: 2021-10-14 | Payer: Medicaid Other

## 2021-10-14 ENCOUNTER — Ambulatory Visit
Admission: EM | Admit: 2021-10-14 | Discharge: 2021-10-14 | Disposition: A | Discharge status: Home / Self Care | Payer: Self-pay | Attending: Family Medicine | Admitting: Family Medicine

## 2021-10-14 ENCOUNTER — Ambulatory Visit (INDEPENDENT_AMBULATORY_CARE_PROVIDER_SITE_OTHER): Payer: Self-pay

## 2021-10-14 DIAGNOSIS — M79671 Pain in right foot: Secondary | ICD-10-CM

## 2021-10-14 DIAGNOSIS — M25571 Pain in right ankle and joints of right foot: Secondary | ICD-10-CM

## 2021-10-14 DIAGNOSIS — S93491A Sprain of other ligament of right ankle, initial encounter: Secondary | ICD-10-CM

## 2021-10-14 DIAGNOSIS — L309 Dermatitis, unspecified: Secondary | ICD-10-CM

## 2021-10-14 DIAGNOSIS — W19XXXA Unspecified fall, initial encounter: Secondary | ICD-10-CM

## 2021-10-14 MED ORDER — PROMETHAZINE-DM 6.25-15 MG/5ML PO SYRP: 5.0000 mL | ORAL_SOLUTION | Freq: Four times a day (QID) | ORAL | 0 refills | Status: DC | PRN

## 2021-10-14 MED ORDER — ACETAMINOPHEN 500 MG PO TABS: 500.0000 mg | ORAL_TABLET | Freq: Four times a day (QID) | ORAL | 0 refills | Status: AC | PRN

## 2021-10-14 MED ORDER — TRIAMCINOLONE ACETONIDE 0.1 % EX CREA: TOPICAL_CREAM | Freq: Two times a day (BID) | CUTANEOUS | 2 refills | Status: AC

## 2021-10-14 NOTE — ED Provider Notes (Addendum)
[facility]   [id number] 10/14/21 Arrival Time: 1612  ASSESSMENT & PLAN:  1. Sprain of anterior talofibular ligament of right ankle, initial encounter   2. Acute right ankle pain   3. Right foot pain   4. Eczema, unspecified type    I have personally viewed the imaging studies ordered this visit. No fractures or bony abnormalities appreciated on RIGHT foot/ankle films. Discussed with her.  ASO. Crutches. WBAT.  Discharge Medication List as of 10/14/2021  5:33 PM     START taking these medications   Details  acetaminophen (TYLENOL) 500 MG tablet Take 1 tablet (500 mg total) by mouth every 6 (six) hours as needed for moderate pain., Starting Wed 10/14/2021, Normal       Work/school excuse note: provided.  Recommend:  Follow-up Information     [facility].   Why: If worsening or failing to improve as anticipated. Contact information: [address] [place] [address] [phone]               For eczema exacerbation. Begin: Meds ordered this encounter  Medications   triamcinolone 0.1%-Cetaphil equivalent 3:1 cream mixture    Sig: Apply topically 2 (two) times daily.    Dispense:  454 g    Refill:  2   No signs of skin infection.  Reviewed expectations re: course of current medical issues. Questions answered. Outlined signs and symptoms indicating need for more acute intervention. Patient verbalized understanding. After Visit Summary given.  SUBJECTIVE: History from: patient. Ashley Carlson is a 21 y.o. female who reports fairly persistent mild to moderate pain of her right lateral ankle and dorsal foot pain; described as aching; without radiation. Onset: abrupt. First noted: yesterday. Injury/trama: reports "foot went asleep and I tried to stand up and heard a crack"; immed pain. Still able to bear weight.. Symptoms have progressed to a point and plateaued since beginning. Aggravating  factors: certain movements, weight bearing, and prolonged walking/standing. Alleviating factors: have not been identified. Associated symptoms: none reported. Extremity sensation changes or weakness: none. No tx PTA. History of similar: no.  History reviewed. No pertinent surgical history.   Also reports eczema exacerbation; R forearm. Much itching. Out of steroid cream.  OBJECTIVE:  Vitals:   10/14/21 1638  BP: 112/74  Pulse: 100  Resp: 18  Temp: 98 F (36.7 C)  TempSrc: Oral  SpO2: 97%    General appearance: alert; no distress HEENT: Mobile; AT Neck: supple with FROM Resp: unlabored respirations Extremities: RLE: warm with well perfused appearance; fairly well localized moderate tenderness over right lateral ankle that extends to dorsal foot; without gross deformities; swelling: minimal; bruising: none; ankle and all toes with FROM CV: brisk extremity capillary refill of RLE; 2+ DP pulse of RLE. Skin: warm and dry; dry and cracked skin on R forearm consistent with eczema Neurologic: normal sensation and strength of RLE Psychological: alert and cooperative; normal mood and affect  Imaging: DG Foot Complete Right  Result Date: 10/14/2021 CLINICAL DATA:  Right ankle pain.  injury yesterday EXAM: RIGHT FOOT COMPLETE - 3+ VIEW COMPARISON:  None Available. FINDINGS: There is no evidence of fracture or dislocation. There is no evidence of arthropathy or other focal bone abnormality. Soft tissues are unremarkable. IMPRESSION: Negative. Electronically Signed   By: Charlett Nose M.D.   On: 10/14/2021 17:03   DG Ankle Complete Right  Result Date: 10/14/2021 CLINICAL DATA:  Right ankle pain EXAM: RIGHT ANKLE -  COMPLETE 3+ VIEW COMPARISON:  None Available. FINDINGS: Lateral soft tissue swelling. No acute bony abnormality. Specifically, no fracture, subluxation, or dislocation. IMPRESSION: No acute bony abnormality. Electronically Signed   By: Charlett Nose M.D.   On: 10/14/2021 17:02       Allergies  Allergen Reactions   Aspirin Shortness Of Breath   Aspirin Shortness Of Breath   Latex Hives and Shortness Of Breath   Wheat Bran Shortness Of Breath   Wheat Bran Shortness Of Breath   Egg [Eggs Or Egg-Derived Products] Nausea And Vomiting   Eggs Or Egg-Derived Products Nausea And Vomiting   Latex Other (See Comments)    Breaks out   Milk-Related Compounds Nausea And Vomiting   Milk-Related Compounds Nausea And Vomiting    Past Medical History:  Diagnosis Date   ADHD (attention deficit hyperactivity disorder)    pt. takes vyvanse   Allergy    Environmental allergies, does not take meds   Anxiety    Asthma    uses albuterol inhaler PRN   Depression    Headache(784.0)    Heart murmur    Does not require prophylaxis   Seizures (HCC)    reports h/o seizures when she was younger but stopped at age 89yo.     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   Social History   Socioeconomic History   Marital status: Single    Spouse name: Not on file   Number of children: Not on file   Years of education: Not on file   Highest education level: Not on file  Occupational History   Occupation: student    Comment: Needs to pass summer school to rise to 8th grade  Tobacco Use   Smoking status: Every Day   Smokeless tobacco: Never  Vaping Use   Vaping Use: Not on file  Substance and Sexual Activity   Alcohol use: Yes    Comment: daily   Drug use: No   Sexual activity: Never    Birth control/protection: None  Other Topics Concern   Not on file  Social History Narrative   ** Merged History Encounter **       Social Determinants of Health   Financial Resource Strain: Not on file  Food Insecurity: Not on file  Transportation Needs: Not on file  Physical Activity: Not on file  Stress: Not on file  Social Connections: Not on file   Family History  Problem Relation Age of Onset   Deep vein thrombosis Mother    Healthy Father     Depression Paternal Grandmother        pt. is vague regarding FMhx    Drug abuse Cousin        pt. is vague regarding FMHx   History reviewed. No pertinent surgical history.     Mardella Layman, MD 10/14/21 [facility]    Mardella Layman, MD 10/14/21 [phone]

## 2021-10-14 NOTE — ED Notes (Signed)
Pt added cc of eczema requesting tx

## 2021-10-14 NOTE — ED Triage Notes (Signed)
Pt c/o right ankle pain onset yesterday. States foot went to sleep and tried to walk it off and "I heard a bone crack."

## 2021-10-14 NOTE — ED Notes (Signed)
Pt called twice on phone with no answer and once in lobby with no answer.

## 2021-11-25 IMAGING — CT CT CERVICAL SPINE W/O CM
3 of 4 series · 11 of 33 positions shown, 13 images · non-contrast
Comparison: Head CT dated 09/18/2018.

CLINICAL DATA: 18-year-old female with trauma.

EXAM:
CT HEAD WITHOUT CONTRAST
CT CERVICAL SPINE WITHOUT CONTRAST
TECHNIQUE: Multidetector CT imaging of the head and cervical spine was
performed following the standard protocol without intravenous
contrast. Multiplanar CT image reconstructions of the cervical spine
were also generated.

[Series 5: c_spine 2.0 st · axial · 0.34mm/px · z∈[-334,-210]mm · 3 of 94 slices shown, 4 images]
[im 16/94  soft-tissue]
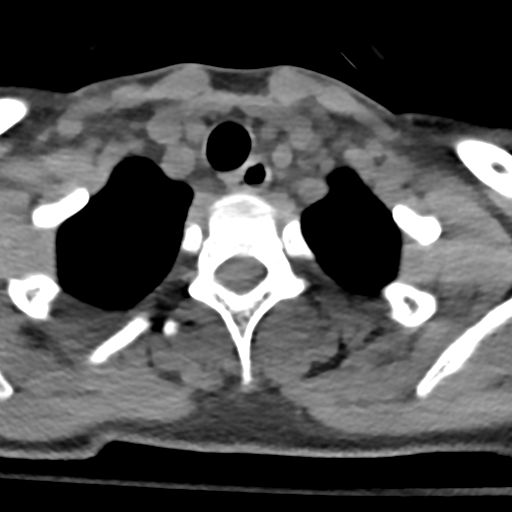
[im 16/94  bone]
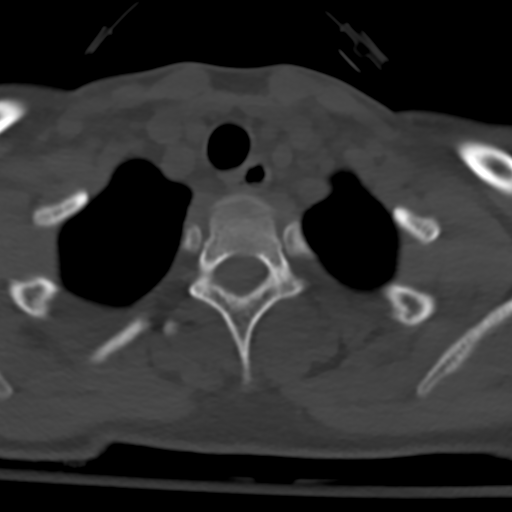
[im 47/94  bone]
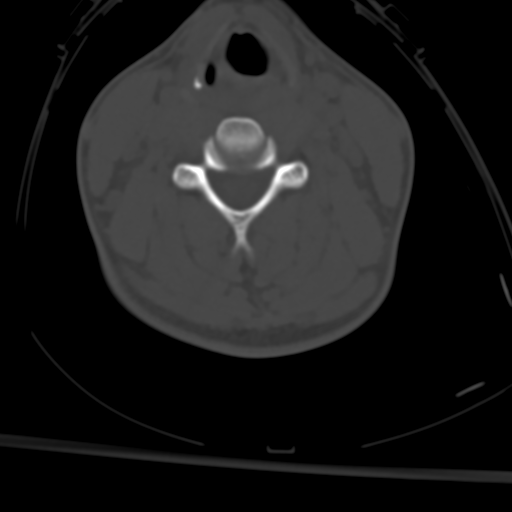
[im 78/94  bone]
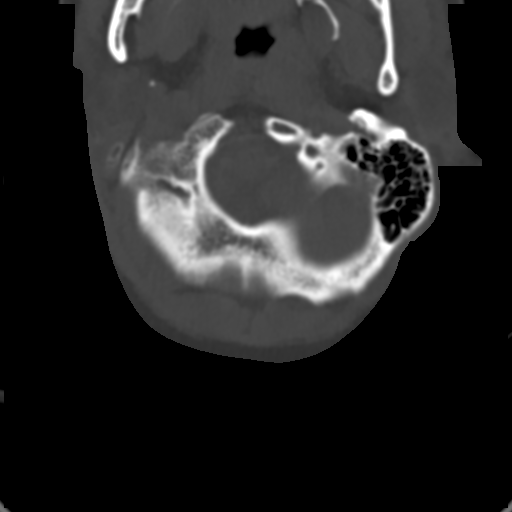

[Series 8: coronal bone · coronal · 0.24mm/px · 3 of 61 slices shown]
[im 13/61  bone]
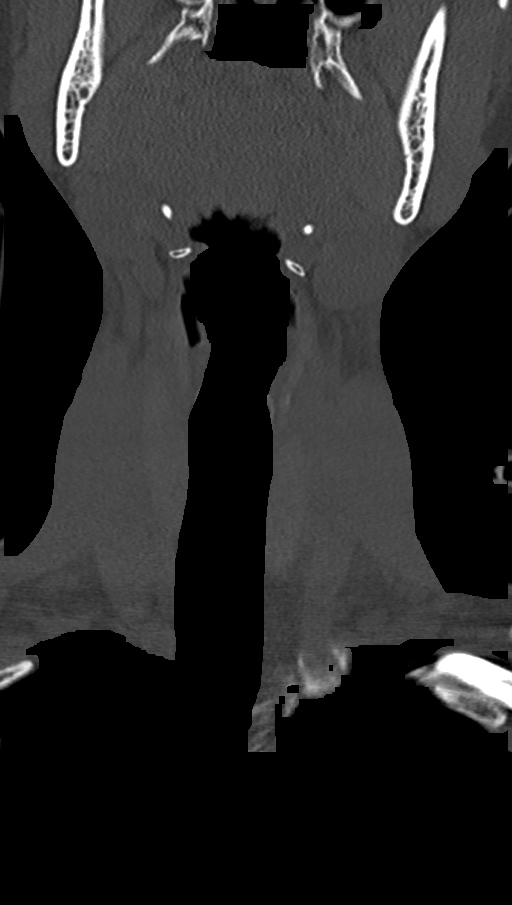
[im 25/61  bone]
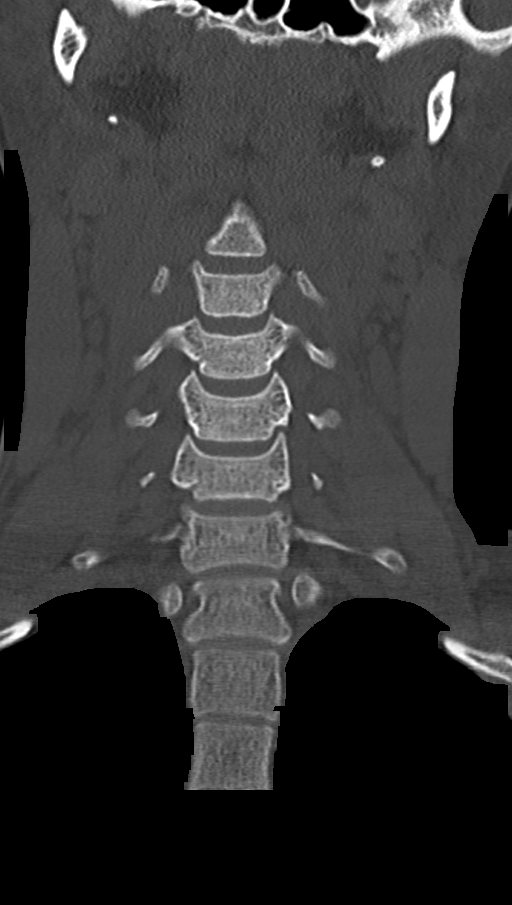
[im 37/61  bone]
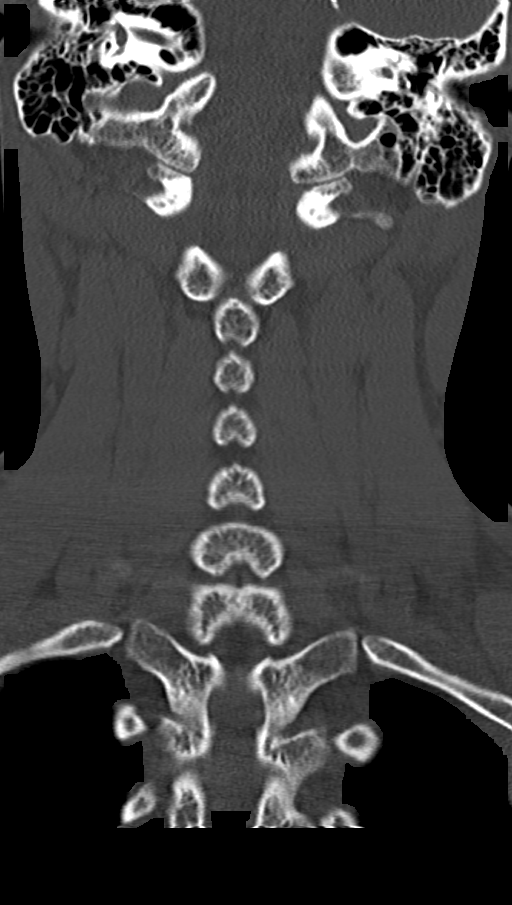

[Series 9: sagittal bone · sagittal · 0.24mm/px · 5 of 61 slices shown, 6 images]
[im 21/61  bone]
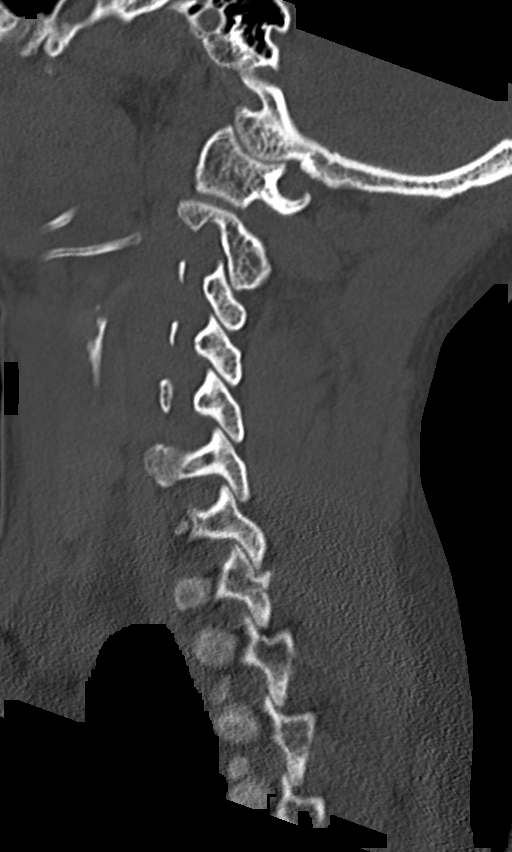
[im 26/61  bone]
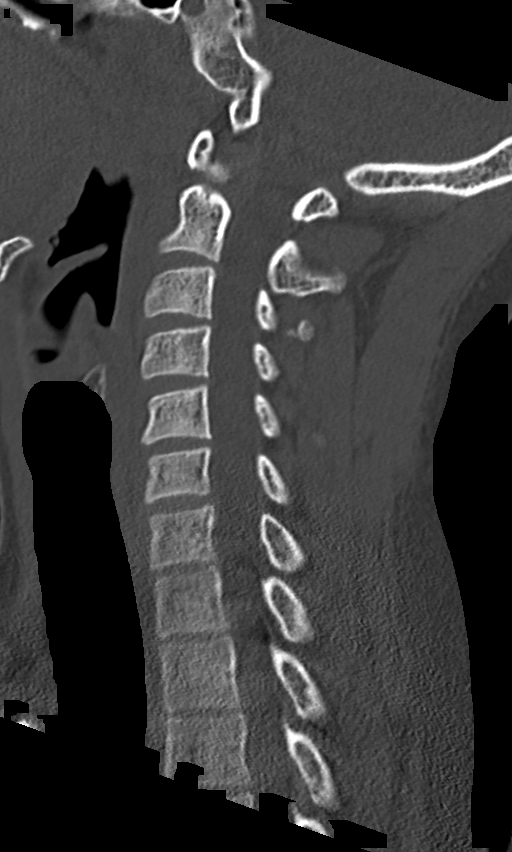
[im 31/61  soft-tissue]
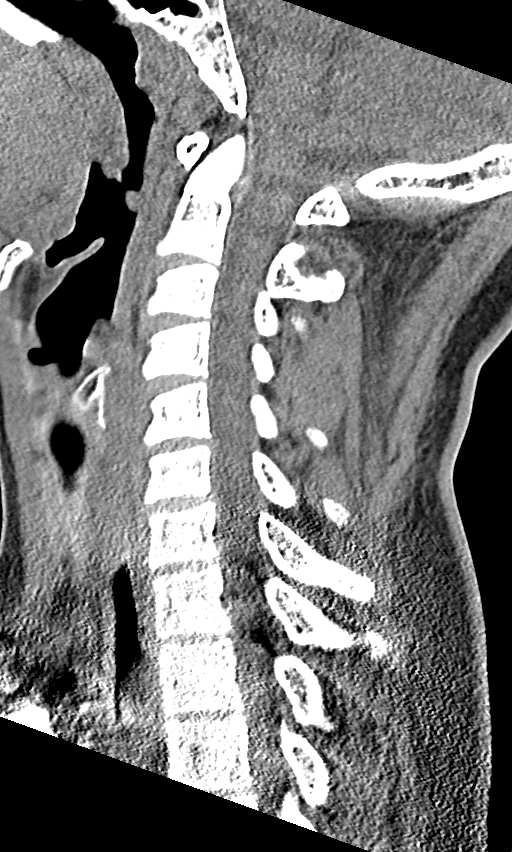
[im 31/61  bone]
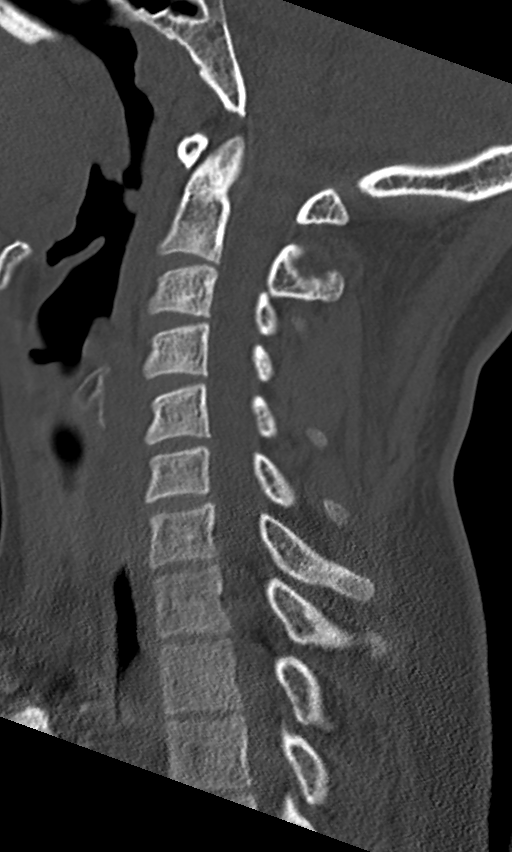
[im 36/61  bone]
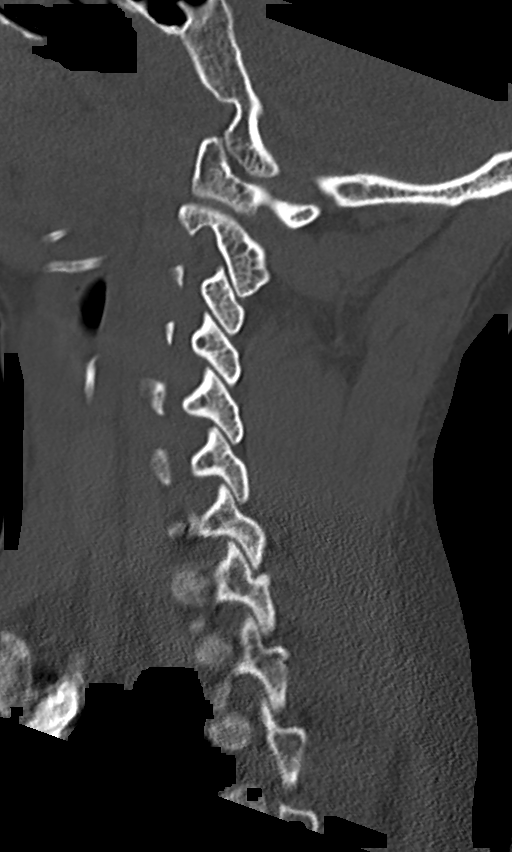
[im 41/61  bone]
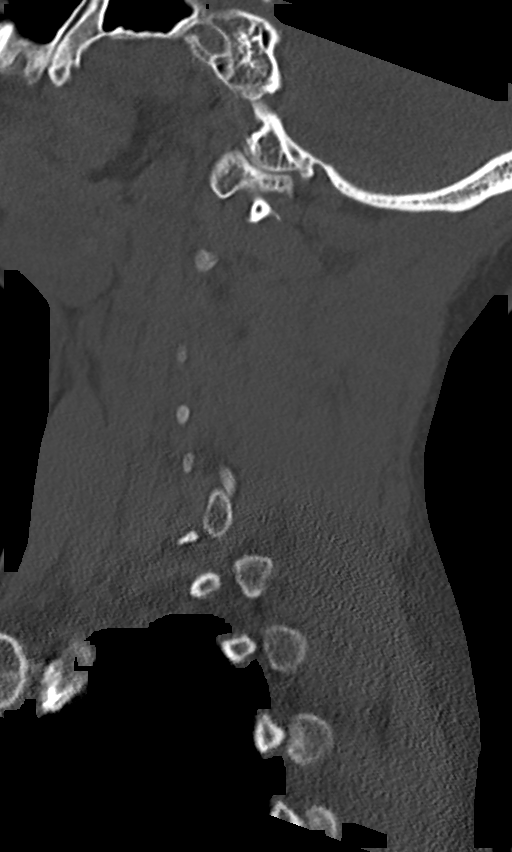

[11 of 33 positions shown; findings below may reference images not displayed]

FINDINGS: CT HEAD FINDINGS

Brain: No evidence of acute infarction, hemorrhage, hydrocephalus,
extra-axial collection or mass lesion/mass effect.

Vascular: No hyperdense vessel or unexpected calcification.

Skull: Normal. Negative for fracture or focal lesion.

Sinuses/Orbits: No acute finding.

Other: None

CT CERVICAL SPINE FINDINGS

Alignment: Normal.

Skull base and vertebrae: No acute fracture. No primary bone lesion
or focal pathologic process.

Soft tissues and spinal canal: No prevertebral fluid or swelling. No
visible canal hematoma.

Disc levels:  No acute findings. No degenerative changes.

Upper chest: Negative.

Other: None
IMPRESSION: 1. Normal unenhanced CT of the brain.
2. No acute/traumatic cervical spine pathology.

## 2023-11-08 ENCOUNTER — Other Ambulatory Visit: Payer: Self-pay | Admitting: Internal Medicine

## 2023-11-08 DIAGNOSIS — N6311 Unspecified lump in the right breast, upper outer quadrant: Secondary | ICD-10-CM

## 2023-12-26 ENCOUNTER — Other Ambulatory Visit: Payer: Self-pay
